# Patient Record
Sex: Male | Born: 1960 | Race: Black or African American | Hispanic: No | Marital: Married | Smoking: Former smoker
Health system: Southern US, Community
[De-identification: ages and names within clinical notes are randomized; demographics above are authoritative.]

## PROBLEM LIST (undated history)

## (undated) DIAGNOSIS — I10 Essential (primary) hypertension: Secondary | ICD-10-CM

## (undated) DIAGNOSIS — E7800 Pure hypercholesterolemia, unspecified: Secondary | ICD-10-CM

---

## 1999-03-02 ENCOUNTER — Ambulatory Visit: Admission: RE | Admit: 1999-03-02 | Discharge: 1999-03-02 | Payer: Self-pay | Admitting: Family Medicine

## 2006-03-01 ENCOUNTER — Emergency Department (HOSPITAL_COMMUNITY): Admission: EM | Admit: 2006-03-01 | Discharge: 2006-03-01 | Payer: Self-pay | Admitting: Emergency Medicine

## 2008-01-06 ENCOUNTER — Emergency Department (HOSPITAL_COMMUNITY): Admission: EM | Admit: 2008-01-06 | Discharge: 2008-01-06 | Payer: Self-pay | Admitting: Emergency Medicine

## 2011-03-06 ENCOUNTER — Emergency Department (HOSPITAL_COMMUNITY)
Admission: EM | Admit: 2011-03-06 | Discharge: 2011-03-06 | Disposition: A | Discharge status: Home / Self Care | Payer: PRIVATE HEALTH INSURANCE | Attending: Emergency Medicine | Admitting: Emergency Medicine

## 2011-03-06 DIAGNOSIS — E119 Type 2 diabetes mellitus without complications: Secondary | ICD-10-CM | POA: Insufficient documentation

## 2011-03-06 DIAGNOSIS — M545 Low back pain, unspecified: Secondary | ICD-10-CM | POA: Insufficient documentation

## 2011-03-06 DIAGNOSIS — I10 Essential (primary) hypertension: Secondary | ICD-10-CM | POA: Insufficient documentation

## 2011-06-14 MED ORDER — SODIUM CHLORIDE 0.45 % IV SOLN: Freq: Once | INTRAVENOUS | Status: DC

## 2011-06-15 ENCOUNTER — Encounter (INDEPENDENT_AMBULATORY_CARE_PROVIDER_SITE_OTHER): Payer: Self-pay | Admitting: Internal Medicine

## 2011-06-15 ENCOUNTER — Encounter (HOSPITAL_COMMUNITY): Admission: RE | Payer: Self-pay | Source: Ambulatory Visit

## 2011-06-15 ENCOUNTER — Ambulatory Visit (HOSPITAL_COMMUNITY)
Admission: RE | Admit: 2011-06-15 | Payer: PRIVATE HEALTH INSURANCE | Source: Ambulatory Visit | Admitting: Internal Medicine

## 2011-06-15 SURGERY — COLONOSCOPY
Anesthesia: Moderate Sedation

## 2011-07-13 ENCOUNTER — Telehealth (INDEPENDENT_AMBULATORY_CARE_PROVIDER_SITE_OTHER): Payer: Self-pay | Admitting: *Deleted

## 2011-07-13 DIAGNOSIS — Z1211 Encounter for screening for malignant neoplasm of colon: Secondary | ICD-10-CM

## 2011-07-13 NOTE — Telephone Encounter (Signed)
TCS sch'd 09/27/11 @ 10:45 (9:45), 1/2 metformin day before, movi prep instructions given

## 2011-08-22 ENCOUNTER — Other Ambulatory Visit (INDEPENDENT_AMBULATORY_CARE_PROVIDER_SITE_OTHER): Payer: Self-pay | Admitting: *Deleted

## 2011-08-22 DIAGNOSIS — Z1211 Encounter for screening for malignant neoplasm of colon: Secondary | ICD-10-CM

## 2011-09-04 ENCOUNTER — Telehealth (INDEPENDENT_AMBULATORY_CARE_PROVIDER_SITE_OTHER): Payer: Self-pay | Admitting: *Deleted

## 2011-09-04 MED ORDER — PEG-KCL-NACL-NASULF-NA ASC-C 100 G PO SOLR: 1.0000 | Freq: Once | ORAL | Status: DC

## 2011-09-04 NOTE — Telephone Encounter (Signed)
Patient needs movi prep 

## 2011-09-13 ENCOUNTER — Telehealth (INDEPENDENT_AMBULATORY_CARE_PROVIDER_SITE_OTHER): Payer: Self-pay | Admitting: *Deleted

## 2011-09-13 NOTE — Telephone Encounter (Signed)
PCP/Requesting MD:  Terie Purser  Name: Evan Estes  DOB: February 06, 2061  Home Phone: [phone]      Procedure: TCS  Reason/Indication:  SCREENING  Has patient had this procedure before?  NO  If so, when, by whom and where?    Is there a family history of colon cancer?  PATIENT NOT SURE  Who?  What age when diagnosed?    Is patient diabetic?   YES      Does patient have prosthetic heart valve?  NO  Do you have a pacemaker?  NO  Has patient had joint replacement within last 12 months?  NO  Is patient on Coumadin, Plavix and/or Aspirin? NO  Medications: PROVEX CV DAILY, METFORMIN 500 MG BID, LISINOPRIL 10 MG DAILY  Allergies: NKDA  Pharmacy: [email or web address]  Medication Adjustment: TAKE 1/2 NORMAL DOSE OF METFORMIN DAY BEFORE  Procedure date & time: 09/27/11 @ 10:45

## 2011-09-13 NOTE — Telephone Encounter (Signed)
Agree with colonoscopy.

## 2011-09-26 MED ORDER — SODIUM CHLORIDE 0.45 % IV SOLN
Freq: Once | INTRAVENOUS | Status: AC
  Administered 2011-09-27: 1000 mL via INTRAVENOUS

## 2011-09-27 ENCOUNTER — Encounter (HOSPITAL_COMMUNITY): Payer: Self-pay | Admitting: *Deleted

## 2011-09-27 ENCOUNTER — Ambulatory Visit (HOSPITAL_COMMUNITY)
Admission: RE | Admit: 2011-09-27 | Discharge: 2011-09-27 | Disposition: A | Discharge status: Home / Self Care | Payer: BC Managed Care – PPO | Source: Ambulatory Visit | Attending: Internal Medicine | Admitting: Internal Medicine

## 2011-09-27 ENCOUNTER — Encounter (HOSPITAL_COMMUNITY)
Admission: RE | Disposition: A | Discharge status: Home / Self Care | Payer: Self-pay | Source: Ambulatory Visit | Attending: Internal Medicine

## 2011-09-27 ENCOUNTER — Other Ambulatory Visit (INDEPENDENT_AMBULATORY_CARE_PROVIDER_SITE_OTHER): Payer: Self-pay | Admitting: Internal Medicine

## 2011-09-27 DIAGNOSIS — D128 Benign neoplasm of rectum: Secondary | ICD-10-CM

## 2011-09-27 DIAGNOSIS — Z1211 Encounter for screening for malignant neoplasm of colon: Secondary | ICD-10-CM

## 2011-09-27 DIAGNOSIS — K644 Residual hemorrhoidal skin tags: Secondary | ICD-10-CM

## 2011-09-27 DIAGNOSIS — Z79899 Other long term (current) drug therapy: Secondary | ICD-10-CM | POA: Insufficient documentation

## 2011-09-27 DIAGNOSIS — E119 Type 2 diabetes mellitus without complications: Secondary | ICD-10-CM | POA: Insufficient documentation

## 2011-09-27 DIAGNOSIS — D129 Benign neoplasm of anus and anal canal: Secondary | ICD-10-CM | POA: Insufficient documentation

## 2011-09-27 DIAGNOSIS — Z01812 Encounter for preprocedural laboratory examination: Secondary | ICD-10-CM | POA: Insufficient documentation

## 2011-09-27 DIAGNOSIS — I10 Essential (primary) hypertension: Secondary | ICD-10-CM | POA: Insufficient documentation

## 2011-09-27 HISTORY — PX: COLONOSCOPY: SHX5424

## 2011-09-27 HISTORY — DX: Essential (primary) hypertension: I10

## 2011-09-27 SURGERY — COLONOSCOPY
Anesthesia: Moderate Sedation

## 2011-09-27 MED ORDER — STERILE WATER FOR IRRIGATION IR SOLN
Status: DC | PRN
  Administered 2011-09-27: 10:00:00

## 2011-09-27 MED ORDER — MEPERIDINE HCL 50 MG/ML IJ SOLN
INTRAMUSCULAR | Status: AC
  Filled 2011-09-27: qty 1

## 2011-09-27 MED ORDER — MIDAZOLAM HCL 5 MG/5ML IJ SOLN
INTRAMUSCULAR | Status: DC | PRN
  Administered 2011-09-27 (×3): 2 mg via INTRAVENOUS

## 2011-09-27 MED ORDER — MIDAZOLAM HCL 5 MG/5ML IJ SOLN
INTRAMUSCULAR | Status: DC
Start: 2011-09-27 — End: 2011-09-27
  Filled 2011-09-27: qty 10

## 2011-09-27 MED ORDER — MEPERIDINE HCL 50 MG/ML IJ SOLN
INTRAMUSCULAR | Status: DC | PRN
  Administered 2011-09-27 (×2): 25 mg via INTRAVENOUS

## 2011-09-27 NOTE — Op Note (Addendum)
COLONOSCOPY PROCEDURE REPORT  PATIENT:  Evan Estes  MR#:  [id number] Birthdate:  Jul 21, 1961, 50 y.o., male Endoscopist:  Dr. Malissa Hippo, MD Referred By:  Dr. Colette Ribas, MD Procedure Date: 09/27/2011  Procedure:   Colonoscopy  Indications:  Patient is 50 year old African male who is undergoing average risk screening colonoscopy.  Informed Consent: Procedure and risks were reviewed with the patient and informed consent obtained.   Medications:  Demerol 50 mg IV Versed 6 mg IV  Description of procedure:  After a digital rectal exam was performed, that colonoscope was advanced from the anus through the rectum and colon to the area of the cecum, ileocecal valve and appendiceal orifice. The cecum was deeply intubated. These structures were well-seen and photographed for the record. From the level of the cecum and ileocecal valve, the scope was slowly and cautiously withdrawn. The mucosal surfaces were carefully surveyed utilizing scope tip to flexion to facilitate fold flattening as needed. The scope was pulled down into the rectum where a thorough exam including retroflexion was performed.  Findings:   Prep excellent. Colonic mucosa normal throughout. 3 mm rectal polyp ablated via cold biopsy. Small hemorrhoids below the dentate line.  Therapeutic/Diagnostic Maneuvers Performed:  See above  Complications:  None  Cecal Withdrawal Time:  14 minutes  Impression:  Examination performed to cecum. Small rectal polyp ablated via cold biopsy. Small external hemorrhoids.  Recommendations:  Standard instructions given. I will be contacting patient with results of biopsy.  [name]  09/27/2011 10:57 AM  CC: Dr. No primary provider on file. & Dr. No ref. provider found  Patient was given prescription for metformin 500 mg by mouth twice a day 60 does is with 5 refills. Should also advised to make an appointment to see his PCP

## 2011-09-27 NOTE — H&P (Signed)
Evan Estes is an 50 y.o. male.   Chief Complaint: Patient is here for colonoscopy. HPI: Patient is 50 year old of white male who is in for average risk screening colonoscopy. He denies abdominal pain, change in bowel habits or rectal bleeding. Family history is negative for colorectal carcinoma.  Past Medical History  Diagnosis Date  . Hypertension   . Diabetes mellitus     History reviewed. No pertinent past surgical history.  Family History  Problem Relation Age of Onset  . Anesthesia problems Neg Hx    Social History:  reports that he has been smoking Cigars.  He does not have any smokeless tobacco history on file. He reports that he drinks alcohol. He reports that he does not use illicit drugs.  Allergies: Not on File  Medications Prior to Admission  Medication Dose Route Frequency Provider Last Rate Last Dose  . 0.45 % sodium chloride infusion   Intravenous Once Malissa Hippo, MD 20 mL/hr at 09/27/11 1017 1,000 mL at 09/27/11 1017  . meperidine (DEMEROL) 50 MG/ML injection           . midazolam (VERSED) 5 MG/5ML injection            Medications Prior to Admission  Medication Sig Dispense Refill  . lisinopril (PRINIVIL,ZESTRIL) 10 MG tablet Take 10 mg by mouth daily.        . metFORMIN (GLUCOPHAGE) 500 MG tablet Take 500 mg by mouth 2 (two) times daily with a meal.        . UNABLE TO FIND daily. Provex CV         Results for orders placed during the hospital encounter of 09/27/11 (from the past 48 hour(s))  GLUCOSE, CAPILLARY     Status: Abnormal   Collection Time   09/27/11 10:14 AM      Component Value Range Comment   Glucose-Capillary 127 (*) 70 - 99 (mg/dL)    Comment 1 Documented in Chart      No results found.  Review of Systems  Constitutional: Negative for malaise/fatigue.  Gastrointestinal: Negative for abdominal pain, diarrhea, constipation, blood in stool and melena.    Pulse 78, temperature 98.5 F (36.9 C), temperature source Oral, resp. rate  20, height 5\' 8"  (1.727 m), weight 190 lb (86.183 kg), SpO2 97.00%. Physical Exam  Constitutional: He appears well-developed and well-nourished.  HENT:  Mouth/Throat: Oropharynx is clear and moist.  Eyes: Conjunctivae are normal. No scleral icterus.  Neck: No thyromegaly present.  Cardiovascular: Normal rate, regular rhythm and normal heart sounds.   No murmur heard. Respiratory: Effort normal.  GI: Soft. He exhibits no distension and no mass. There is no tenderness.  Musculoskeletal: He exhibits no edema.  Lymphadenopathy:    He has no cervical adenopathy.  Neurological: He is alert.  Skin: Skin is warm and dry.     Assessment/Plan Average risk screening colonoscopy.  Evan Estes U 09/27/2011, 10:26 AM

## 2011-10-12 ENCOUNTER — Encounter (HOSPITAL_COMMUNITY): Payer: Self-pay | Admitting: Internal Medicine

## 2011-10-18 ENCOUNTER — Encounter (INDEPENDENT_AMBULATORY_CARE_PROVIDER_SITE_OTHER): Payer: Self-pay | Admitting: *Deleted

## 2014-02-04 ENCOUNTER — Emergency Department (HOSPITAL_COMMUNITY): Payer: PRIVATE HEALTH INSURANCE

## 2014-02-04 ENCOUNTER — Encounter (HOSPITAL_COMMUNITY): Payer: Self-pay | Admitting: Emergency Medicine

## 2014-02-04 ENCOUNTER — Emergency Department (HOSPITAL_COMMUNITY)
Admission: EM | Admit: 2014-02-04 | Discharge: 2014-02-04 | Disposition: A | Discharge status: Home / Self Care | Payer: PRIVATE HEALTH INSURANCE | Attending: Emergency Medicine | Admitting: Emergency Medicine

## 2014-02-04 DIAGNOSIS — Z79899 Other long term (current) drug therapy: Secondary | ICD-10-CM | POA: Insufficient documentation

## 2014-02-04 DIAGNOSIS — F17200 Nicotine dependence, unspecified, uncomplicated: Secondary | ICD-10-CM | POA: Insufficient documentation

## 2014-02-04 DIAGNOSIS — I10 Essential (primary) hypertension: Secondary | ICD-10-CM | POA: Insufficient documentation

## 2014-02-04 DIAGNOSIS — R002 Palpitations: Secondary | ICD-10-CM

## 2014-02-04 DIAGNOSIS — E119 Type 2 diabetes mellitus without complications: Secondary | ICD-10-CM | POA: Insufficient documentation

## 2014-02-04 DIAGNOSIS — I495 Sick sinus syndrome: Secondary | ICD-10-CM | POA: Insufficient documentation

## 2014-02-04 DIAGNOSIS — R51 Headache: Secondary | ICD-10-CM | POA: Insufficient documentation

## 2014-02-04 LAB — CBC WITH DIFFERENTIAL/PLATELET
BASOS ABS: 0 10*3/uL (ref 0.0–0.1)
BASOS PCT: 0 % (ref 0–1)
Eosinophils Absolute: 0.2 10*3/uL (ref 0.0–0.7)
Eosinophils Relative: 3 % (ref 0–5)
HCT: 41.6 % (ref 39.0–52.0)
HEMOGLOBIN: 14.1 g/dL (ref 13.0–17.0)
Lymphocytes Relative: 43 % (ref 12–46)
Lymphs Abs: 2.9 10*3/uL (ref 0.7–4.0)
MCH: 26.4 pg (ref 26.0–34.0)
MCHC: 33.9 g/dL (ref 30.0–36.0)
MCV: 77.9 fL — ABNORMAL LOW (ref 78.0–100.0)
Monocytes Absolute: 0.5 10*3/uL (ref 0.1–1.0)
Monocytes Relative: 7 % (ref 3–12)
NEUTROS ABS: 3.1 10*3/uL (ref 1.7–7.7)
NEUTROS PCT: 47 % (ref 43–77)
PLATELETS: 279 10*3/uL (ref 150–400)
RBC: 5.34 MIL/uL (ref 4.22–5.81)
RDW: 15.5 % (ref 11.5–15.5)
WBC: 6.8 10*3/uL (ref 4.0–10.5)

## 2014-02-04 LAB — COMPREHENSIVE METABOLIC PANEL
ALBUMIN: 3.7 g/dL (ref 3.5–5.2)
ALK PHOS: 78 U/L (ref 39–117)
ALT: 19 U/L (ref 0–53)
AST: 25 U/L (ref 0–37)
BILIRUBIN TOTAL: 0.3 mg/dL (ref 0.3–1.2)
BUN: 20 mg/dL (ref 6–23)
CHLORIDE: 100 meq/L (ref 96–112)
CO2: 28 mEq/L (ref 19–32)
Calcium: 9.6 mg/dL (ref 8.4–10.5)
Creatinine, Ser: 0.96 mg/dL (ref 0.50–1.35)
GFR calc Af Amer: >90 mL/min (ref >90)
GFR calc non Af Amer: >90 mL/min (ref >90)
Glucose, Bld: 166 mg/dL — ABNORMAL HIGH (ref 70–99)
POTASSIUM: 3.1 meq/L — AB (ref 3.7–5.3)
SODIUM: 140 meq/L (ref 137–147)
TOTAL PROTEIN: 6.9 g/dL (ref 6.0–8.3)

## 2014-02-04 LAB — URINALYSIS, ROUTINE W REFLEX MICROSCOPIC
BILIRUBIN URINE: NEGATIVE
GLUCOSE, UA: NEGATIVE mg/dL
KETONES UR: NEGATIVE mg/dL
LEUKOCYTES UA: NEGATIVE
Nitrite: NEGATIVE
PH: 6 (ref 5.0–8.0)
Protein, ur: NEGATIVE mg/dL
Urobilinogen, UA: 0.2 mg/dL (ref 0.0–1.0)

## 2014-02-04 LAB — URINE MICROSCOPIC-ADD ON

## 2014-02-04 LAB — TROPONIN I: Troponin I: <0.3 ng/mL (ref <0.30)

## 2014-02-04 NOTE — ED Notes (Signed)
Discharge instructions reviewed with pt, questions answered. Pt verbalized understanding.  

## 2014-02-04 NOTE — Discharge Instructions (Signed)
Arterial Hypertension Take your medications as prescribed. Follow up with your doctor. Return to the ED if you develop new or worsening symptoms. Arterial hypertension (high blood pressure) is a condition of elevated pressure in your blood vessels. Hypertension over a long period of time is a risk factor for strokes, heart attacks, and heart failure. It is also the leading cause of kidney (renal) failure.  CAUSES   In Adults -- Over 90% of all hypertension has no known cause. This is called essential or primary hypertension. In the other 10% of people with hypertension, the increase in blood pressure is caused by another disorder. This is called secondary hypertension. Important causes of secondary hypertension are:  Heavy alcohol use.  Obstructive sleep apnea.  Hyperaldosterosim (Conn's syndrome).  Steroid use.  Chronic kidney failure.  Hyperparathyroidism.  Medications.  Renal artery stenosis.  Pheochromocytoma.  Cushing's disease.  Coarctation of the aorta.  Scleroderma renal crisis.  Licorice (in excessive amounts).  Drugs (cocaine, methamphetamine). Your caregiver can explain any items above that apply to you.  In Children -- Secondary hypertension is more common and should always be considered.  Pregnancy -- Few women of childbearing age have high blood pressure. However, up to 10% of them develop hypertension of pregnancy. Generally, this will not harm the woman. It may be a sign of 3 complications of pregnancy: preeclampsia, HELLP syndrome, and eclampsia. Follow up and control with medication is necessary. SYMPTOMS   This condition normally does not produce any noticeable symptoms. It is usually found during a routine exam.  Malignant hypertension is a late problem of high blood pressure. It may have the following symptoms:  Headaches.  Blurred vision.  End-organ damage (this means your kidneys, heart, lungs, and other organs are being damaged).  Stressful  situations can increase the blood pressure. If a person with normal blood pressure has their blood pressure go up while being seen by their caregiver, this is often termed "white coat hypertension." Its importance is not known. It may be related with eventually developing hypertension or complications of hypertension.  Hypertension is often confused with mental tension, stress, and anxiety. DIAGNOSIS  The diagnosis is made by 3 separate blood pressure measurements. They are taken at least 1 week apart from each other. If there is organ damage from hypertension, the diagnosis may be made without repeat measurements. Hypertension is usually identified by having blood pressure readings:  Above 140/90 mmHg measured in both arms, at 3 separate times, over a couple weeks.  Over 130/80 mmHg should be considered a risk factor and may require treatment in patients with diabetes. Blood pressure readings over 120/80 mmHg are called "pre-hypertension" even in non-diabetic patients. To get a true blood pressure measurement, use the following guidelines. Be aware of the factors that can alter blood pressure readings.  Take measurements at least 1 hour after caffeine.  Take measurements 30 minutes after smoking and without any stress. This is another reason to quit smoking  it raises your blood pressure.  Use a proper cuff size. Ask your caregiver if you are not sure about your cuff size.  Most home blood pressure cuffs are automatic. They will measure systolic and diastolic pressures. The systolic pressure is the pressure reading at the start of sounds. Diastolic pressure is the pressure at which the sounds disappear. If you are elderly, measure pressures in multiple postures. Try sitting, lying or standing.  Sit at rest for a minimum of 5 minutes before taking measurements.  You should not  be on any medications like decongestants. These are found in many cold medications.  Record your blood pressure  readings and review them with your caregiver. If you have hypertension:  Your caregiver may do tests to be sure you do not have secondary hypertension (see "causes" above).  Your caregiver may also look for signs of metabolic syndrome. This is also called Syndrome X or Insulin Resistance Syndrome. You may have this syndrome if you have type 2 diabetes, abdominal obesity, and abnormal blood lipids in addition to hypertension.  Your caregiver will take your medical and family history and perform a physical exam.  Diagnostic tests may include blood tests (for glucose, cholesterol, potassium, and kidney function), a urinalysis, or an EKG. Other tests may also be necessary depending on your condition. PREVENTION  There are important lifestyle issues that you can adopt to reduce your chance of developing hypertension:  Maintain a normal weight.  Limit the amount of salt (sodium) in your diet.  Exercise often.  Limit alcohol intake.  Get enough potassium in your diet. Discuss specific advice with your caregiver.  Follow a DASH diet (dietary approaches to stop hypertension). This diet is rich in fruits, vegetables, and low-fat dairy products, and avoids certain fats. PROGNOSIS  Essential hypertension cannot be cured. Lifestyle changes and medical treatment can lower blood pressure and reduce complications. The prognosis of secondary hypertension depends on the underlying cause. Many people whose hypertension is controlled with medicine or lifestyle changes can live a normal, healthy life.  RISKS AND COMPLICATIONS  While high blood pressure alone is not an illness, it often requires treatment due to its short- and long-term effects on many organs. Hypertension increases your risk for:  CVAs or strokes (cerebrovascular accident).  Heart failure due to chronically high blood pressure (hypertensive cardiomyopathy).  Heart attack (myocardial infarction).  Damage to the retina (hypertensive  retinopathy).  Kidney failure (hypertensive nephropathy). Your caregiver can explain list items above that apply to you. Treatment of hypertension can significantly reduce the risk of complications. TREATMENT   For overweight patients, weight loss and regular exercise are recommended. Physical fitness lowers blood pressure.  Mild hypertension is usually treated with diet and exercise. A diet rich in fruits and vegetables, fat-free dairy products, and foods low in fat and salt (sodium) can help lower blood pressure. Decreasing salt intake decreases blood pressure in a 1/3 of people.  Stop smoking if you are a smoker. The steps above are highly effective in reducing blood pressure. While these actions are easy to suggest, they are difficult to achieve. Most patients with moderate or severe hypertension end up requiring medications to bring their blood pressure down to a normal level. There are several classes of medications for treatment. Blood pressure pills (antihypertensives) will lower blood pressure by their different actions. Lowering the blood pressure by 10 mmHg may decrease the risk of complications by as much as 25%. The goal of treatment is effective blood pressure control. This will reduce your risk for complications. Your caregiver will help you determine the best treatment for you according to your lifestyle. What is excellent treatment for one person, may not be for you. HOME CARE INSTRUCTIONS   Do not smoke.  Follow the lifestyle changes outlined in the "Prevention" section.  If you are on medications, follow the directions carefully. Blood pressure medications must be taken as prescribed. Skipping doses reduces their benefit. It also puts you at risk for problems.  Follow up with your caregiver, as directed.  If  you are asked to monitor your blood pressure at home, follow the guidelines in the "Diagnosis" section above. SEEK MEDICAL CARE IF:   You think you are having  medication side effects.  You have recurrent headaches or lightheadedness.  You have swelling in your ankles.  You have trouble with your vision. SEEK IMMEDIATE MEDICAL CARE IF:   You have sudden onset of chest pain or pressure, difficulty breathing, or other symptoms of a heart attack.  You have a severe headache.  You have symptoms of a stroke (such as sudden weakness, difficulty speaking, difficulty walking). MAKE SURE YOU:   Understand these instructions.  Will watch your condition.  Will get help right away if you are not doing well or get worse. Document Released: 10/02/2005 Document Revised: 12/25/2011 Document Reviewed: 05/02/2007 [facility] Patient Information 2014 [place]. Palpitations  A palpitation is the feeling that your heartbeat is irregular or is faster than normal. It may feel like your heart is fluttering or skipping a beat. Palpitations are usually not a serious problem. However, in some cases, you may need further medical evaluation. CAUSES  Palpitations can be caused by:  Smoking.  Caffeine or other stimulants, such as diet pills or energy drinks.  Alcohol.  Stress and anxiety.  Strenuous physical activity.  Fatigue.  Certain medicines.  Heart disease, especially if you have a history of arrhythmias. This includes atrial fibrillation, atrial flutter, or supraventricular tachycardia.  An improperly working pacemaker or defibrillator. DIAGNOSIS  To find the cause of your palpitations, your caregiver will take your history and perform a physical exam. Tests may also be done, including:  Electrocardiography (ECG). This test records the heart's electrical activity.  Cardiac monitoring. This allows your caregiver to monitor your heart rate and rhythm in real time.  Holter monitor. This is a portable device that records your heartbeat and can help diagnose heart arrhythmias. It allows your caregiver to track your heart activity for several  days, if needed.  Stress tests by exercise or by giving medicine that makes the heart beat faster. TREATMENT  Treatment of palpitations depends on the cause of your symptoms and can vary greatly. Most cases of palpitations do not require any treatment other than time, relaxation, and monitoring your symptoms. Other causes, such as atrial fibrillation, atrial flutter, or supraventricular tachycardia, usually require further treatment. HOME CARE INSTRUCTIONS   Avoid:  Caffeinated coffee, tea, soft drinks, diet pills, and energy drinks.  Chocolate.  Alcohol.  Stop smoking if you smoke.  Reduce your stress and anxiety. Things that can help you relax include:  A method that measures bodily functions so you can learn to control them (biofeedback).  Yoga.  Meditation.  Physical activity such as swimming, jogging, or walking.  Get plenty of rest and sleep. SEEK MEDICAL CARE IF:   You continue to have a fast or irregular heartbeat beyond 24 hours.  Your palpitations occur more often. SEEK IMMEDIATE MEDICAL CARE IF:  You develop chest pain or shortness of breath.  You have a severe headache.  You feel dizzy, or you faint. MAKE SURE YOU:  Understand these instructions.  Will watch your condition.  Will get help right away if you are not doing well or get worse. Document Released: 09/29/2000 Document Revised: 01/27/2013 Document Reviewed: 12/01/2011 [facility] Patient Information 2014 [place].

## 2014-02-04 NOTE — ED Provider Notes (Signed)
CSN: [id number]     Arrival date & time 02/04/14  2031 History   This chart was scribed for Evan Essex, MD by Jenne Campus, ED Scribe. This patient was seen in room APA07/APA07 and the patient's care was started at 9:09 PM.   Chief Complaint  Patient presents with  . Palpitations     The history is provided by the patient. No language interpreter was used.   HPI Comments: Evan Estes is a 53 y.o. male who presents to the Emergency Department complaining of sudden onset of palpations in his chest for the past few hours. He describes the symptoms as "fluttering" with associated mild SOB, mild non-positional lightheadedness and mild, now resolved headaches.  He states that he has been taking one pill every 12 hours, more than his recommended dose without physician approval, in hopes of relieving the associated symptoms. Pt states that he monitors his blood pressure at home and received a reading of 170/90 tonight. BP in the ED is 173/71. He admits that's he ran out of HTN medication for one week ago and got them refilled yesterday. He admits to experiencing prior episodes of the same during periods of uncontrolled HTN. He denies any chest pain, visual disturbances, nausea or emesis as associated symptoms. He denies any prior h/o MI or cardiac issues.   Past Medical History  Diagnosis Date  . Hypertension   . Diabetes mellitus    Past Surgical History  Procedure Laterality Date  . Colonoscopy  09/27/2011    Procedure: COLONOSCOPY;  Surgeon: Rogene Houston, MD;  Location: [facility];  Service: Endoscopy;  Laterality: N/A;  10:45   Family History  Problem Relation Age of Onset  . Anesthesia problems Neg Hx    History  Substance Use Topics  . Smoking status: Current Every Day Smoker    Types: Cigars  . Smokeless tobacco: Not on file  . Alcohol Use: Yes     Comment: occasional    Review of Systems  A complete 10 system review of systems was obtained and all systems are  negative except as noted in the HPI and PMH.    Allergies  Review of patient's allergies indicates no known allergies.  Home Medications   Prior to Admission medications   Medication Sig Start Date End Date Taking? Authorizing Provider  lisinopril (PRINIVIL,ZESTRIL) 10 MG tablet Take 10 mg by mouth daily.      Historical Provider, MD  metFORMIN (GLUCOPHAGE) 500 MG tablet Take 500 mg by mouth 2 (two) times daily with a meal.      Historical Provider, MD   Triage vital: BP 173/71  Pulse 60  Temp(Src) 98 F (36.7 C) (Oral)  Resp 20  Ht 5\' 8"  (1.727 m)  Wt 180 lb (81.647 kg)  BMI 27.38 kg/m2  SpO2 100%  Physical Exam  Nursing note and vitals reviewed. Constitutional: He is oriented to person, place, and time. He appears well-developed and well-nourished. No distress.  HENT:  Head: Normocephalic and atraumatic.  Mouth/Throat: Oropharynx is clear and moist. No oropharyngeal exudate.  Eyes: Conjunctivae and EOM are normal. Pupils are equal, round, and reactive to light.  Neck: Normal range of motion. Neck supple. No tracheal deviation present.  Cardiovascular: Regular rhythm and normal heart sounds.  Bradycardia present.   No murmur heard. Pulmonary/Chest: Effort normal and breath sounds normal. No respiratory distress.  Abdominal: There is no tenderness. There is no rebound and no guarding.  Musculoskeletal: Normal range of motion. He exhibits  no edema and no tenderness.  Neurological: He is alert and oriented to person, place, and time. No cranial nerve deficit. He exhibits normal muscle tone. Coordination normal.  CN 2-12 intact, no ataxia on finger to nose, no nystagmus, 5/5 strength throughout, no pronator drift, Romberg negative, normal gait.  Skin: Skin is warm and dry.  Psychiatric: He has a normal mood and affect. His behavior is normal.    ED Course  Procedures (including critical care time)  DIAGNOSTIC STUDIES: Oxygen Saturation is 100% on RA, Normal by my  interpretation.    COORDINATION OF CARE: 9:18 PM-Discussed treatment plan which includes CXR, CBC panel and CMP with pt at bedside and pt agreed to plan.    Labs Review Labs Reviewed  CBC WITH DIFFERENTIAL - Abnormal; Notable for the following:    MCV 77.9 (*)    All other components within normal limits  COMPREHENSIVE METABOLIC PANEL - Abnormal; Notable for the following:    Potassium 3.1 (*)    Glucose, Bld 166 (*)    All other components within normal limits  URINALYSIS, ROUTINE W REFLEX MICROSCOPIC - Abnormal; Notable for the following:    Specific Gravity, Urine >1.030 (*)    Hgb urine dipstick TRACE (*)    All other components within normal limits  TROPONIN I  URINE MICROSCOPIC-ADD ON    Imaging Review Dg Chest 2 View  02/04/2014   CLINICAL DATA:  Short of breath  EXAM: CHEST - 2 VIEW  COMPARISON:  None.  FINDINGS: Normal heart size. Clear lungs. Mild hyperaeration. No pneumothorax or pleural effusion.  IMPRESSION: No active cardiopulmonary disease.   Electronically Signed   By: Maryclare Bean M.D.   On: 02/04/2014 22:34   Ct Head Wo Contrast  02/04/2014   CLINICAL DATA:  Hypertension, headache, heart palpitations.  EXAM: CT HEAD WITHOUT CONTRAST  TECHNIQUE: Contiguous axial images were obtained from the base of the skull through the vertex without intravenous contrast.  COMPARISON:  None.  FINDINGS: Ventricles and sulci appear symmetrical. No mass effect or midline shift. No abnormal extra-axial fluid collections. Gray-white matter junctions are distinct. Basal cisterns are not effaced. No evidence of acute intracranial hemorrhage. No depressed skull fractures. Visualized paranasal sinuses and mastoid air cells are not opacified.  IMPRESSION: No acute intracranial abnormalities.   Electronically Signed   By: Lucienne Capers M.D.   On: 02/04/2014 22:23     EKG Interpretation   Date/Time:  Wednesday February 04 2014 23:05:18 EDT Ventricular Rate:  52 PR Interval:  190 QRS Duration:  82 QT Interval:  450 QTC Calculation: 418 R Axis:   -2 Text Interpretation:  Sinus bradycardia Septal infarct (cited on or before  04-Feb-2014) Abnormal ECG When compared with ECG of 04-Feb-2014 20:42, No  significant change was found No significant change was found Confirmed by  Wyvonnia Dusky  MD, Yao Hyppolite [phone]) on 02/04/2014 11:57:18 PM      MDM   Final diagnoses:  Hypertension  Palpitations   Patient complains of palpitations that onset this afternoon. Denies any chest pain or shortness of breath. He is out of his blood pressure medications for the past week and just restarted them yesterday. Had a headache yesterday but none today. No focal weakness, numbness or tingling.  EKG sinus bradycardia without EKG changes. Orthostatics negative. Labs with mild hypokalemia.  nonfocal neuro exam. No headache, vision change, chest pain, SOB. No evidence of end organ damage.  Patient instructed to take BP med as prescribed and followup with his  PCP.  HR somewhat low in ED.  No dizziness or lightheadedness. Betablocker may not be best choice for blood pressure control.  Discuss with PCP this week. Return to ED if symptoms worsen.  BP 145/74  Pulse 52  Temp(Src) 98 F (36.7 C) (Oral)  Resp 20  Ht 5\' 8"  (1.727 m)  Wt 180 lb (81.647 kg)  BMI 27.38 kg/m2  SpO2 100%   I personally performed the services described in this documentation, which was scribed in my presence. The recorded information has been reviewed and is accurate.      Evan Essex, MD 02/05/14 1318

## 2014-02-04 NOTE — ED Notes (Signed)
Pt c/o hypertension and palpitations. Pt states he has been out of bp meds for over week and got them filled yesterday and started taking them at that time.

## 2014-06-21 ENCOUNTER — Emergency Department (HOSPITAL_COMMUNITY)
Admission: EM | Admit: 2014-06-21 | Discharge: 2014-06-21 | Disposition: A | Discharge status: Home / Self Care | Payer: PRIVATE HEALTH INSURANCE | Attending: Emergency Medicine | Admitting: Emergency Medicine

## 2014-06-21 ENCOUNTER — Encounter (HOSPITAL_COMMUNITY): Payer: Self-pay | Admitting: Emergency Medicine

## 2014-06-21 DIAGNOSIS — Y9389 Activity, other specified: Secondary | ICD-10-CM | POA: Diagnosis not present

## 2014-06-21 DIAGNOSIS — E119 Type 2 diabetes mellitus without complications: Secondary | ICD-10-CM | POA: Diagnosis not present

## 2014-06-21 DIAGNOSIS — M545 Low back pain, unspecified: Secondary | ICD-10-CM | POA: Diagnosis present

## 2014-06-21 DIAGNOSIS — S335XXA Sprain of ligaments of lumbar spine, initial encounter: Secondary | ICD-10-CM | POA: Diagnosis not present

## 2014-06-21 DIAGNOSIS — Z791 Long term (current) use of non-steroidal anti-inflammatories (NSAID): Secondary | ICD-10-CM | POA: Diagnosis not present

## 2014-06-21 DIAGNOSIS — Z79899 Other long term (current) drug therapy: Secondary | ICD-10-CM | POA: Diagnosis not present

## 2014-06-21 DIAGNOSIS — Z7982 Long term (current) use of aspirin: Secondary | ICD-10-CM | POA: Diagnosis not present

## 2014-06-21 DIAGNOSIS — Y929 Unspecified place or not applicable: Secondary | ICD-10-CM | POA: Insufficient documentation

## 2014-06-21 DIAGNOSIS — X500XXA Overexertion from strenuous movement or load, initial encounter: Secondary | ICD-10-CM | POA: Insufficient documentation

## 2014-06-21 DIAGNOSIS — S39012A Strain of muscle, fascia and tendon of lower back, initial encounter: Secondary | ICD-10-CM

## 2014-06-21 DIAGNOSIS — F17200 Nicotine dependence, unspecified, uncomplicated: Secondary | ICD-10-CM | POA: Insufficient documentation

## 2014-06-21 DIAGNOSIS — I10 Essential (primary) hypertension: Secondary | ICD-10-CM | POA: Diagnosis not present

## 2014-06-21 MED ORDER — METHOCARBAMOL 500 MG PO TABS: 500.0000 mg | ORAL_TABLET | Freq: Two times a day (BID) | ORAL | Status: DC | PRN

## 2014-06-21 MED ORDER — OXYCODONE-ACETAMINOPHEN 5-325 MG PO TABS
2.0000 | ORAL_TABLET | Freq: Once | ORAL | Status: AC
  Administered 2014-06-21: 2 via ORAL
  Filled 2014-06-21: qty 2

## 2014-06-21 MED ORDER — OXYCODONE-ACETAMINOPHEN 5-325 MG PO TABS
1.0000 | ORAL_TABLET | ORAL | Status: DC | PRN
Start: 2014-06-21 — End: 2020-12-30

## 2014-06-21 MED ORDER — NAPROXEN 250 MG PO TABS
500.0000 mg | ORAL_TABLET | Freq: Once | ORAL | Status: AC
  Administered 2014-06-21: 500 mg via ORAL
  Filled 2014-06-21: qty 2

## 2014-06-21 MED ORDER — NAPROXEN 500 MG PO TABS: 500.0000 mg | ORAL_TABLET | Freq: Two times a day (BID) | ORAL | Status: DC

## 2014-06-21 NOTE — ED Provider Notes (Signed)
CSN: [id number]     Arrival date & time 06/21/14  1007 History   This chart was scribed for Evan Acosta, MD, by Neta Ehlers, ED Scribe. This patient was seen in room APA03/APA03 and the patient's care was started at 10:14 AM.  First MD Initiated Contact with Patient 06/21/14 1013     Chief Complaint  Patient presents with  . Back Pain    The history is provided by the patient. No language interpreter was used.   HPI Comments: Evan Estes is a 53 y.o. male who presents to the Emergency Department complaining of right-sided lumbar pain which began three days ago. He states the pain is improved when he is stationary, but the pain is increased with twisting toward the left side or ambulating.  He has used 4 to 5 Motrin a day to address the pain with moderate relief.  The pt denies weakness or numbness to his legs. Mr. Volkov attributes the pain to standing on concrete floors at work; he denies excessive lifting at work. He endorses similar pain in the past; he has experienced the pain bilaterally in the past. He denies dysuria, urinary retention, or a fever. He also denies pathological red flags including a h/o cancer, HIV, or hepatitis, and the use of needles or steroids.    Past Medical History  Diagnosis Date  . Hypertension   . Diabetes mellitus    Past Surgical History  Procedure Laterality Date  . Colonoscopy  09/27/2011    Procedure: COLONOSCOPY;  Surgeon: Rogene Houston, MD;  Location: [facility];  Service: Endoscopy;  Laterality: N/A;  10:45   Family History  Problem Relation Age of Onset  . Anesthesia problems Neg Hx    History  Substance Use Topics  . Smoking status: Current Every Day Smoker    Types: Cigars  . Smokeless tobacco: Not on file  . Alcohol Use: Yes     Comment: occasional    Review of Systems  Genitourinary: Negative for dysuria, frequency, decreased urine volume and difficulty urinating.  Musculoskeletal: Positive for back pain.  Neurological:  Negative for weakness and numbness.    Allergies  Review of patient's allergies indicates no known allergies.  Home Medications   Prior to Admission medications   Medication Sig Start Date End Date Taking? Authorizing Provider  aspirin 325 MG tablet Take 325 mg by mouth daily as needed (Takes when he thinks about it.).    Historical Provider, MD  atenolol-chlorthalidone (TENORETIC) 50-25 MG per tablet Take 1 tablet by mouth daily.    Historical Provider, MD  metFORMIN (GLUCOPHAGE) 500 MG tablet Take 500 mg by mouth 2 (two) times daily with a meal.      Historical Provider, MD  methocarbamol (ROBAXIN) 500 MG tablet Take 1 tablet (500 mg total) by mouth 2 (two) times daily as needed for muscle spasms. 06/21/14   Evan Acosta, MD  naproxen (NAPROSYN) 500 MG tablet Take 1 tablet (500 mg total) by mouth 2 (two) times daily with a meal. 06/21/14   Evan Acosta, MD  oxyCODONE-acetaminophen (PERCOCET) 5-325 MG per tablet Take 1 tablet by mouth every 4 (four) hours as needed. 06/21/14   Evan Acosta, MD   Triage Vitals: BP 163/92  Pulse 80  Temp(Src) 98.4 F (36.9 C) (Oral)  Resp 20  Ht 5\' 8"  (1.727 m)  Wt 180 lb (81.647 kg)  BMI 27.38 kg/m2  SpO2 100%  Physical Exam  Nursing note and vitals reviewed.  Constitutional: He appears well-developed and well-nourished. No distress.  HENT:  Head: Normocephalic and atraumatic.  Eyes: Conjunctivae are normal. Right eye exhibits no discharge. Left eye exhibits no discharge. No scleral icterus.  Cardiovascular: Normal rate, regular rhythm, normal heart sounds and intact distal pulses.  Exam reveals no gallop and no friction rub.   No murmur heard. Pulmonary/Chest: Effort normal and breath sounds normal. No respiratory distress. He has no wheezes. He has no rales.  Abdominal: Soft. Bowel sounds are normal. He exhibits no distension and no mass. There is no tenderness.  Musculoskeletal: Normal range of motion. He exhibits tenderness. He exhibits no  edema.  Tenderness to right lumbar into the right iliac crest.  No straight leg raise pain. No pain with rotation of hip, more with forced flexion.    Neurological: He is alert. Coordination normal.  Normal strength, sensation and reflexes at the knees of the lower extremities  Skin: Skin is warm and dry. No rash noted. No erythema.  Psychiatric: He has a normal mood and affect. His behavior is normal.    ED Course  Procedures (including critical care time)  DIAGNOSTIC STUDIES: Oxygen Saturation is 100% on room air, normal by my interpretation.    COORDINATION OF CARE:  10:25 AM- Discussed treatment plan with patient, and the patient agreed to the plan. The plan includes muscle relaxants.   Labs Review Labs Reviewed - No data to display  Imaging Review No results found.    MDM   Final diagnoses:  Lumbar strain, initial encounter    Overall well appaering without red flags for pathological source for pain.  meds to be given as below, the pt was given the indications for return - expressed his understanding.    Meds given in ED:  Medications  oxyCODONE-acetaminophen (PERCOCET/ROXICET) 5-325 MG per tablet 2 tablet (2 tablets Oral Given 06/21/14 1037)  naproxen (NAPROSYN) tablet 500 mg (500 mg Oral Given 06/21/14 1037)    Discharge Medication List as of 06/21/2014 10:26 AM    START taking these medications   Details  methocarbamol (ROBAXIN) 500 MG tablet Take 1 tablet (500 mg total) by mouth 2 (two) times daily as needed for muscle spasms., Starting 06/21/2014, Until Discontinued, Print    naproxen (NAPROSYN) 500 MG tablet Take 1 tablet (500 mg total) by mouth 2 (two) times daily with a meal., Starting 06/21/2014, Until Discontinued, Print    oxyCODONE-acetaminophen (PERCOCET) 5-325 MG per tablet Take 1 tablet by mouth every 4 (four) hours as needed., Starting 06/21/2014, Until Discontinued, Print         I personally performed the services described in this documentation,  which was scribed in my presence. The recorded information has been reviewed and is accurate.       Evan Acosta, MD 06/21/14 [phone]

## 2014-06-21 NOTE — ED Notes (Signed)
Pt reports has chronic lower back pain and pain flared up Friday evening after work.  Says pain is in r lower back and radiates into r hip and upper leg.

## 2014-06-21 NOTE — Discharge Instructions (Signed)

## 2014-06-22 ENCOUNTER — Emergency Department (HOSPITAL_COMMUNITY)
Admission: EM | Admit: 2014-06-22 | Discharge: 2014-06-22 | Disposition: A | Discharge status: Home / Self Care | Payer: PRIVATE HEALTH INSURANCE | Attending: Emergency Medicine | Admitting: Emergency Medicine

## 2014-06-22 ENCOUNTER — Encounter (HOSPITAL_COMMUNITY): Payer: Self-pay | Admitting: Emergency Medicine

## 2014-06-22 ENCOUNTER — Emergency Department (HOSPITAL_COMMUNITY): Payer: PRIVATE HEALTH INSURANCE

## 2014-06-22 DIAGNOSIS — G8911 Acute pain due to trauma: Secondary | ICD-10-CM | POA: Insufficient documentation

## 2014-06-22 DIAGNOSIS — M129 Arthropathy, unspecified: Secondary | ICD-10-CM | POA: Diagnosis not present

## 2014-06-22 DIAGNOSIS — E119 Type 2 diabetes mellitus without complications: Secondary | ICD-10-CM | POA: Diagnosis not present

## 2014-06-22 DIAGNOSIS — Z791 Long term (current) use of non-steroidal anti-inflammatories (NSAID): Secondary | ICD-10-CM | POA: Insufficient documentation

## 2014-06-22 DIAGNOSIS — I10 Essential (primary) hypertension: Secondary | ICD-10-CM | POA: Insufficient documentation

## 2014-06-22 DIAGNOSIS — M1990 Unspecified osteoarthritis, unspecified site: Secondary | ICD-10-CM

## 2014-06-22 DIAGNOSIS — Z79899 Other long term (current) drug therapy: Secondary | ICD-10-CM | POA: Insufficient documentation

## 2014-06-22 DIAGNOSIS — F17200 Nicotine dependence, unspecified, uncomplicated: Secondary | ICD-10-CM | POA: Insufficient documentation

## 2014-06-22 DIAGNOSIS — M545 Low back pain, unspecified: Secondary | ICD-10-CM | POA: Insufficient documentation

## 2014-06-22 DIAGNOSIS — M5430 Sciatica, unspecified side: Secondary | ICD-10-CM | POA: Diagnosis not present

## 2014-06-22 DIAGNOSIS — M25559 Pain in unspecified hip: Secondary | ICD-10-CM | POA: Diagnosis not present

## 2014-06-22 DIAGNOSIS — M25551 Pain in right hip: Secondary | ICD-10-CM

## 2014-06-22 MED ORDER — MORPHINE SULFATE 4 MG/ML IJ SOLN
4.0000 mg | Freq: Once | INTRAMUSCULAR | Status: AC
Start: 2014-06-22 — End: 2014-06-22
  Administered 2014-06-22: 4 mg via INTRAMUSCULAR
  Filled 2014-06-22: qty 1

## 2014-06-22 MED ORDER — OXYCODONE-ACETAMINOPHEN 5-325 MG PO TABS
1.0000 | ORAL_TABLET | Freq: Once | ORAL | Status: AC
  Administered 2014-06-22: 1 via ORAL
  Filled 2014-06-22: qty 1

## 2014-06-22 MED ORDER — ONDANSETRON HCL 4 MG PO TABS
4.0000 mg | ORAL_TABLET | Freq: Once | ORAL | Status: AC
  Administered 2014-06-22: 4 mg via ORAL
  Filled 2014-06-22: qty 1

## 2014-06-22 MED ORDER — KETOROLAC TROMETHAMINE 10 MG PO TABS
10.0000 mg | ORAL_TABLET | Freq: Once | ORAL | Status: AC
  Administered 2014-06-22: 10 mg via ORAL
  Filled 2014-06-22: qty 1

## 2014-06-22 MED ORDER — DIAZEPAM 5 MG PO TABS
5.0000 mg | ORAL_TABLET | Freq: Once | ORAL | Status: AC
  Administered 2014-06-22: 5 mg via ORAL
  Filled 2014-06-22: qty 1

## 2014-06-22 NOTE — ED Notes (Signed)
Pt presents to ED c/o of lower back and RT thigh pain. Pt was treated Sunday for same complaint. Pt states medication he was given "is not working." NAD noted in triage.

## 2014-06-22 NOTE — ED Provider Notes (Signed)
Medical screening examination/treatment/procedure(s) were performed by non-physician practitioner and as supervising physician I was immediately available for consultation/collaboration.   Buddy Loeffelholz, MD 06/22/14 2237 

## 2014-06-22 NOTE — Discharge Instructions (Signed)
Your CT scan is negative for acute spine or vertebrae problems, there are signs of degenerative disease, or arthritis. Arthritis, Nonspecific Arthritis is inflammation of a joint. This usually means pain, redness, warmth or swelling are present. One or more joints may be involved. There are a number of types of arthritis. Your caregiver may not be able to tell what type of arthritis you have right away. CAUSES  The most common cause of arthritis is the wear and tear on the joint (osteoarthritis). This causes damage to the cartilage, which can break down over time. The knees, hips, back and neck are most often affected by this type of arthritis. Other types of arthritis and common causes of joint pain include:  Sprains and other injuries near the joint. Sometimes minor sprains and injuries cause pain and swelling that develop hours later.  Rheumatoid arthritis. This affects hands, feet and knees. It usually affects both sides of your body at the same time. It is often associated with chronic ailments, fever, weight loss and general weakness.  Crystal arthritis. Gout and pseudo gout can cause occasional acute severe pain, redness and swelling in the foot, ankle, or knee.  Infectious arthritis. Bacteria can get into a joint through a break in overlying skin. This can cause infection of the joint. Bacteria and viruses can also spread through the blood and affect your joints.  Drug, infectious and allergy reactions. Sometimes joints can become mildly painful and slightly swollen with these types of illnesses. SYMPTOMS   Pain is the main symptom.  Your joint or joints can also be red, swollen and warm or hot to the touch.  You may have a fever with certain types of arthritis, or even feel overall ill.  The joint with arthritis will hurt with movement. Stiffness is present with some types of arthritis. DIAGNOSIS  Your caregiver will suspect arthritis based on your description of your symptoms and  on your exam. Testing may be needed to find the type of arthritis:  Blood and sometimes urine tests.  X-ray tests and sometimes CT or MRI scans.  Removal of fluid from the joint (arthrocentesis) is done to check for bacteria, crystals or other causes. Your caregiver (or a specialist) will numb the area over the joint with a local anesthetic, and use a needle to remove joint fluid for examination. This procedure is only minimally uncomfortable.  Even with these tests, your caregiver may not be able to tell what kind of arthritis you have. Consultation with a specialist (rheumatologist) may be helpful. TREATMENT  Your caregiver will discuss with you treatment specific to your type of arthritis. If the specific type cannot be determined, then the following general recommendations may apply. Treatment of severe joint pain includes:  Rest.  Elevation.  Anti-inflammatory medication (for example, ibuprofen) may be prescribed. Avoiding activities that cause increased pain.  Only take over-the-counter or prescription medicines for pain and discomfort as recommended by your caregiver.  Cold packs over an inflamed joint may be used for 10 to 15 minutes every hour. Hot packs sometimes feel better, but do not use overnight. Do not use hot packs if you are diabetic without your caregiver's permission.  A cortisone shot into arthritic joints may help reduce pain and swelling.  Any acute arthritis that gets worse over the next 1 to 2 days needs to be looked at to be sure there is no joint infection. Long-term arthritis treatment involves modifying activities and lifestyle to reduce joint stress jarring. This can include  weight loss. Also, exercise is needed to nourish the joint cartilage and remove waste. This helps keep the muscles around the joint strong. HOME CARE INSTRUCTIONS   Do not take aspirin to relieve pain if gout is suspected. This elevates uric acid levels.  Only take over-the-counter or  prescription medicines for pain, discomfort or fever as directed by your caregiver.  Rest the joint as much as possible.  If your joint is swollen, keep it elevated.  Use crutches if the painful joint is in your leg.  Drinking plenty of fluids may help for certain types of arthritis.  Follow your caregiver's dietary instructions.  Try low-impact exercise such as:  Swimming.  Water aerobics.  Biking.  Walking.  Morning stiffness is often relieved by a warm shower.  Put your joints through regular range-of-motion. SEEK MEDICAL CARE IF:   You do not feel better in 24 hours or are getting worse.  You have side effects to medications, or are not getting better with treatment. SEEK IMMEDIATE MEDICAL CARE IF:   You have a fever.  You develop severe joint pain, swelling or redness.  Many joints are involved and become painful and swollen.  There is severe back pain and/or leg weakness.  You have loss of bowel or bladder control. Document Released: 11/09/2004 Document Revised: 12/25/2011 Document Reviewed: 11/25/2008 [facility] Patient Information 2015 [place], [place]. This information is not intended to replace advice given to you by your health care provider. Make sure you discuss any questions you have with your health care provider. . The x-ray of your pelvis is negative for fracture or dislocation or acute problem. Please apply heat to your back and hip. Please continue your current medications. Please see your primary physician for assistance with your pain management.

## 2014-06-22 NOTE — ED Notes (Signed)
Sima Matas, PAC, at bedside to assess.

## 2014-06-22 NOTE — ED Provider Notes (Signed)
CSN: [id number]     Arrival date & time 06/22/14  1854 History   None  This chart was scribed for non-physician practitioner Lily Kocher, PA-C, working with No att. providers found by Rosary Lively, ED scribe. This patient was seen in room APFT23/APFT23 and the patient's care was started at 8:05 PM.    Chief Complaint  Patient presents with  . Back Pain   Patient is a 53 y.o. male presenting with back pain. The history is provided by the patient. No language interpreter was used.  Back Pain Location:  Lumbar spine Quality:  Aching and shooting Radiates to: Right hip. Pain severity:  Severe Pain is:  Same all the time Timing:  Constant Progression:  Worsening Chronicity:  New Relieved by:  Nothing Worsened by:  Movement, standing and twisting Ineffective treatments:  Narcotics and NSAIDs Associated symptoms: no abdominal pain, no bladder incontinence, no bowel incontinence, no numbness and no paresthesias    HPI Comments:  Evan Estes is a 53 y.o. male who presents to the Emergency Department complaining of lower back and right hip pain. Pt reports that he was seen yesterday morning and diagnosed with lumbar strain. Pt reports that he has returned to ED because the medication he is taking is not providing adequate relief. Pt has been taking percocet and naproxen. Pt reports that he experienced relief this morning, however as day progressed he began to experience pain again. Pt reports that he is unable to return to work with the pain that he is experiencing. Pt denies diagnosis of ruptured or herniated disc. Pt denies surgery to back. Pt has not had imaging prior to yesterday. Pt goes to clinic for primary care.   Past Medical History  Diagnosis Date  . Hypertension   . Diabetes mellitus    Past Surgical History  Procedure Laterality Date  . Colonoscopy  09/27/2011    Procedure: COLONOSCOPY;  Surgeon: Rogene Houston, MD;  [facility];  Service: Endoscopy;  Laterality:  N/A;  10:45   Family History  Problem Relation Age of Onset  . Anesthesia problems Neg Hx    History  Substance Use Topics  . Smoking status: Current Every Day Smoker    Types: Cigars  . Smokeless tobacco: Not on file  . Alcohol Use: Yes     Comment: occasional    Review of Systems  Gastrointestinal: Negative for abdominal pain and bowel incontinence.  Genitourinary: Negative for bladder incontinence.  Musculoskeletal: Positive for arthralgias, back pain and myalgias.  Neurological: Negative for numbness and paresthesias.  All other systems reviewed and are negative.     Allergies  Review of patient's allergies indicates no known allergies.  Home Medications   Prior to Admission medications   Medication Sig Start Date End Date Taking? Authorizing Provider  aspirin 325 MG tablet Take 325 mg by mouth daily as needed (Takes when he thinks about it.).    Historical Provider, MD  atenolol-chlorthalidone (TENORETIC) 50-25 MG per tablet Take 1 tablet by mouth daily.    Historical Provider, MD  metFORMIN (GLUCOPHAGE) 500 MG tablet Take 500 mg by mouth 2 (two) times daily with a meal.      Historical Provider, MD  methocarbamol (ROBAXIN) 500 MG tablet Take 1 tablet (500 mg total) by mouth 2 (two) times daily as needed for muscle spasms. 06/21/14   Johnna Acosta, MD  naproxen (NAPROSYN) 500 MG tablet Take 1 tablet (500 mg total) by mouth 2 (two) times daily with  a meal. 06/21/14   Johnna Acosta, MD  oxyCODONE-acetaminophen (PERCOCET) 5-325 MG per tablet Take 1 tablet by mouth every 4 (four) hours as needed. 06/21/14   Johnna Acosta, MD   BP 165/85  Pulse 62  Temp(Src) 98.9 F (37.2 C) (Oral)  Resp 18  Ht 5\' 8"  (1.727 m)  Wt 190 lb (86.183 kg)  BMI 28.90 kg/m2  SpO2 100% Physical Exam  Nursing note and vitals reviewed. Constitutional: He is oriented to person, place, and time. He appears well-developed and well-nourished. No distress.  HENT:  Head: Normocephalic and atraumatic.   Eyes: EOM are normal. Pupils are equal, round, and reactive to light.  Neck: Normal range of motion. Neck supple.  Cardiovascular: Normal rate and regular rhythm.   Pulmonary/Chest: Effort normal and breath sounds normal. No respiratory distress. He has no wheezes. He has no rales.  Abdominal: Soft. Bowel sounds are normal. There is no tenderness.  Musculoskeletal: Normal range of motion. He exhibits no edema.       Lumbar back: He exhibits tenderness. He exhibits normal range of motion, no deformity, no spasm and normal pulse.  Paraspinal tenderness at the lower lumbar region on the right. Pain with attempted ROM of the right hip. No shortening appreciated.   Neurological: He is alert and oriented to person, place, and time. He has normal strength. No cranial nerve deficit or sensory deficit. Coordination and gait normal.  Reflex Scores:      Bicep reflexes are 2+ on the right side and 2+ on the left side.      Brachioradialis reflexes are 2+ on the right side and 2+ on the left side.      Patellar reflexes are 2+ on the right side and 2+ on the left side.      Achilles reflexes are 2+ on the right side and 2+ on the left side. Pt will not cooperate with complete neurologic examination due to pain.   Skin: Skin is warm and dry.  Psychiatric: He has a normal mood and affect. His behavior is normal.    ED Course  Procedures  DIAGNOSTIC STUDIES: Oxygen Saturation is 100% on RA, normal by my interpretation.  COORDINATION OF CARE: 8:18 PM-Discussed treatment plan which includes back xray with pt at bedside and pt agreed to plan.  Labs Review Labs Reviewed - No data to display  Imaging Review No results found.   EKG Interpretation None      MDM  Signs are within normal limits. Pulse oximetry is 100% on room air.  Patient remains in the wheelchair and will not get out of the chair because of pain. The examination is limited because the patient states he has pain of his back and  his hip area.  CT scan of the lumbar spine shows no fracture or dislocation. There is multilevel degenerative changes. The central canal remains widely patent. X-ray of the right hip and pelvis shows no fracture or dislocation. The bilateral hip spaces are preserved. No other problems appreciated.  The test results were given to the patient in terms he and his significant other understood. The patient expressed that he was leaving the same way that he came in. We discussed the findings again of the CT scan and the x-ray and the diagnoses that these 2 diagnostic tests excluded. We discussed inflammation. The patient was given instructions to see his primary physician for assistance with his pain management. He was also given the name of a spine specialist in  [place] to attempt to make contact with for assistance with his discomfort at this point. Patient is advised to continue his Naprosyn and Percocet. A dose of Toradol and Percocet was given to the patient at discharge.    Final diagnoses:  None    *I have reviewed nursing notes, vital signs, and all appropriate lab and imaging results for this patient.**  **I personally performed the services described in this documentation, which was scribed in my presence. The recorded information has been reviewed and is accurate.Lenox Ahr, PA-C 06/22/14 2219

## 2014-07-24 ENCOUNTER — Ambulatory Visit (INDEPENDENT_AMBULATORY_CARE_PROVIDER_SITE_OTHER): Payer: Commercial Managed Care - PPO | Admitting: Emergency Medicine

## 2014-07-24 VITALS — BP 140/88 | HR 60 | Temp 98.1°F | Resp 16 | Ht 66.25 in | Wt 189.4 lb

## 2014-07-24 DIAGNOSIS — M545 Low back pain: Secondary | ICD-10-CM

## 2014-07-24 NOTE — Patient Instructions (Signed)
Back Pain, Adult Low back pain is very common. About 1 in 5 people have back pain.The cause of low back pain is rarely dangerous. The pain often gets better over time.About half of people with a sudden onset of back pain feel better in just 2 weeks. About 8 in 10 people feel better by 6 weeks.  CAUSES Some common causes of back pain include:  Strain of the muscles or ligaments supporting the spine.  Wear and tear (degeneration) of the spinal discs.  Arthritis.  Direct injury to the back. DIAGNOSIS Most of the time, the direct cause of low back pain is not known.However, back pain can be treated effectively even when the exact cause of the pain is unknown.Answering your caregiver's questions about your overall health and symptoms is one of the most accurate ways to make sure the cause of your pain is not dangerous. If your caregiver needs more information, he or she may order lab work or imaging tests (X-rays or MRIs).However, even if imaging tests show changes in your back, this usually does not require surgery. HOME CARE INSTRUCTIONS For many people, back pain returns.Since low back pain is rarely dangerous, it is often a condition that people can learn to manageon their own.   Remain active. It is stressful on the back to sit or stand in one place. Do not sit, drive, or stand in one place for more than 30 minutes at a time. Take short walks on level surfaces as soon as pain allows.Try to increase the length of time you walk each day.  Do not stay in bed.Resting more than 1 or 2 days can delay your recovery.  Do not avoid exercise or work.Your body is made to move.It is not dangerous to be active, even though your back may hurt.Your back will likely heal faster if you return to being active before your pain is gone.  Pay attention to your body when you bend and lift. Many people have less discomfortwhen lifting if they bend their knees, keep the load close to their bodies,and  avoid twisting. Often, the most comfortable positions are those that put less stress on your recovering back.  Find a comfortable position to sleep. Use a firm mattress and lie on your side with your knees slightly bent. If you lie on your back, put a pillow under your knees.  Only take over-the-counter or prescription medicines as directed by your caregiver. Over-the-counter medicines to reduce pain and inflammation are often the most helpful.Your caregiver may prescribe muscle relaxant drugs.These medicines help dull your pain so you can more quickly return to your normal activities and healthy exercise.  Put ice on the injured area.  Put ice in a plastic bag.  Place a towel between your skin and the bag.  Leave the ice on for 15-20 minutes, 03-04 times a day for the first 2 to 3 days. After that, ice and heat may be alternated to reduce pain and spasms.  Ask your caregiver about trying back exercises and gentle massage. This may be of some benefit.  Avoid feeling anxious or stressed.Stress increases muscle tension and can worsen back pain.It is important to recognize when you are anxious or stressed and learn ways to manage it.Exercise is a great option. SEEK MEDICAL CARE IF:  You have pain that is not relieved with rest or medicine.  You have pain that does not improve in 1 week.  You have new symptoms.  You are generally not feeling well. SEEK   IMMEDIATE MEDICAL CARE IF:   You have pain that radiates from your back into your legs.  You develop new bowel or bladder control problems.  You have unusual weakness or numbness in your arms or legs.  You develop nausea or vomiting.  You develop abdominal pain.  You feel faint. Document Released: 10/02/2005 Document Revised: 04/02/2012 Document Reviewed: 02/03/2014 [employer] Patient Information 2015 [employer]. This information is not intended to replace advice given to you by your health care provider. Make sure you  discuss any questions you have with your health care provider.  

## 2014-07-24 NOTE — Progress Notes (Signed)
[facility] [address], [place] [address] [phone]  Date:  07/24/2014   Name:  Evan Estes   DOB:  [birth year]   MRN:  [id number]  PCP:  Purvis Kilts, MD    Chief Complaint: Paperwork   History of Present Illness:  Evan Estes is a 53 y.o. very pleasant male patient who presents with the following:  Patient comes to the office following back-to-back visits to the er for back pain.  Put off work until Thursday and given percocet due pain in back preventing him from working was wheelchair bound. Now to office following improvement.  Has improved pain and is off the weekend. He is requesting a "prn" FMLA form "in case I need it in the future". Pain non radiating.  No weakness or neuro symptoms. No improvement with over the counter medications or other home remedies.  Denies other complaint or health concern today.   There are no active problems to display for this patient.   Past Medical History  Diagnosis Date  . Hypertension   . Diabetes mellitus     Past Surgical History  Procedure Laterality Date  . Colonoscopy  09/27/2011    Procedure: COLONOSCOPY;  Surgeon: Rogene Houston, MD;  [facility];  Service: Endoscopy;  Laterality: N/A;  10:45    History  Substance Use Topics  . Smoking status: Current Every Day Smoker    Types: Cigars  . Smokeless tobacco: Not on file  . Alcohol Use: Yes     Comment: occasional    Family History  Problem Relation Age of Onset  . Anesthesia problems Neg Hx     No Known Allergies  Medication list has been reviewed and updated.  Current Outpatient Prescriptions on File Prior to Visit  Medication Sig Dispense Refill  . atenolol-chlorthalidone (TENORETIC) 50-25 MG per tablet Take 1 tablet by mouth daily.      . metFORMIN (GLUCOPHAGE) 500 MG tablet Take 500 mg by mouth 2 (two) times daily with a meal.        . methocarbamol (ROBAXIN) 500 MG tablet Take 1 tablet (500 mg total) by  mouth 2 (two) times daily as needed for muscle spasms.  20 tablet  0  . naproxen (NAPROSYN) 500 MG tablet Take 1 tablet (500 mg total) by mouth 2 (two) times daily with a meal.  30 tablet  0  . oxyCODONE-acetaminophen (PERCOCET) 5-325 MG per tablet Take 1 tablet by mouth every 4 (four) hours as needed.  20 tablet  0   No current facility-administered medications on file prior to visit.    Review of Systems:  As per HPI, otherwise negative.    Physical Examination: Filed Vitals:   07/24/14 1158  BP: 140/88  Pulse: 60  Temp: 98.1 F (36.7 C)  Resp: 16   Filed Vitals:   07/24/14 1158  Height: 5' 6.25" (1.683 m)  Weight: 189 lb 6.4 oz (85.911 kg)   Body mass index is 30.33 kg/(m^2). Ideal Body Weight: Weight in (lb) to have BMI = 25: 155.7   GEN: WDWN, NAD, Non-toxic, Alert & Oriented x 3 HEENT: Atraumatic, Normocephalic.  Ears and Nose: No external deformity. EXTR: No clubbing/cyanosis/edema NEURO: Normal gait.  PSYCH: Normally interactive. Conversant. Not depressed or anxious appearing.  Calm demeanor.    Assessment and Plan: Back pain Patient instructed to obtain the FMLA form from he who restricts his activities for THAT event, not a prn.  Signed,  Ellison Carwin, MD

## 2015-02-22 ENCOUNTER — Emergency Department (HOSPITAL_COMMUNITY)
Admission: EM | Admit: 2015-02-22 | Discharge: 2015-02-22 | Disposition: A | Discharge status: Home / Self Care | Payer: Commercial Managed Care - PPO | Attending: Emergency Medicine | Admitting: Emergency Medicine

## 2015-02-22 ENCOUNTER — Encounter (HOSPITAL_COMMUNITY): Payer: Self-pay | Admitting: Emergency Medicine

## 2015-02-22 DIAGNOSIS — Z720 Tobacco use: Secondary | ICD-10-CM | POA: Insufficient documentation

## 2015-02-22 DIAGNOSIS — F1210 Cannabis abuse, uncomplicated: Secondary | ICD-10-CM | POA: Insufficient documentation

## 2015-02-22 DIAGNOSIS — Z79899 Other long term (current) drug therapy: Secondary | ICD-10-CM | POA: Insufficient documentation

## 2015-02-22 DIAGNOSIS — R42 Dizziness and giddiness: Secondary | ICD-10-CM | POA: Diagnosis present

## 2015-02-22 DIAGNOSIS — E119 Type 2 diabetes mellitus without complications: Secondary | ICD-10-CM | POA: Diagnosis not present

## 2015-02-22 DIAGNOSIS — I10 Essential (primary) hypertension: Secondary | ICD-10-CM | POA: Diagnosis not present

## 2015-02-22 DIAGNOSIS — R55 Syncope and collapse: Secondary | ICD-10-CM

## 2015-02-22 LAB — COMPREHENSIVE METABOLIC PANEL WITH GFR
ALT: 23 U/L (ref 17–63)
AST: 31 U/L (ref 15–41)
Albumin: 4.3 g/dL (ref 3.5–5.0)
Alkaline Phosphatase: 66 U/L (ref 38–126)
Anion gap: 11 (ref 5–15)
BUN: 11 mg/dL (ref 6–20)
CO2: 25 mmol/L (ref 22–32)
Calcium: 9.4 mg/dL (ref 8.9–10.3)
Chloride: 106 mmol/L (ref 101–111)
Creatinine, Ser: 0.89 mg/dL (ref 0.61–1.24)
GFR calc Af Amer: >60 mL/min
GFR calc non Af Amer: >60 mL/min
Glucose, Bld: 136 mg/dL — ABNORMAL HIGH (ref 70–99)
Potassium: 3.2 mmol/L — ABNORMAL LOW (ref 3.5–5.1)
Sodium: 142 mmol/L (ref 135–145)
Total Bilirubin: 1.1 mg/dL (ref 0.3–1.2)
Total Protein: 6.9 g/dL (ref 6.5–8.1)

## 2015-02-22 LAB — CBC WITH DIFFERENTIAL/PLATELET
Basophils Absolute: 0.1 10*3/uL (ref 0.0–0.1)
Basophils Relative: 1 % (ref 0–1)
Eosinophils Absolute: 0.1 10*3/uL (ref 0.0–0.7)
Eosinophils Relative: 1 % (ref 0–5)
HEMATOCRIT: 42.2 % (ref 39.0–52.0)
Hemoglobin: 14.2 g/dL (ref 13.0–17.0)
Lymphocytes Relative: 46 % (ref 12–46)
Lymphs Abs: 2.7 10*3/uL (ref 0.7–4.0)
MCH: 26.2 pg (ref 26.0–34.0)
MCHC: 33.6 g/dL (ref 30.0–36.0)
MCV: 77.9 fL — ABNORMAL LOW (ref 78.0–100.0)
MONOS PCT: 9 % (ref 3–12)
Monocytes Absolute: 0.6 10*3/uL (ref 0.1–1.0)
NEUTROS PCT: 43 % (ref 43–77)
Neutro Abs: 2.7 10*3/uL (ref 1.7–7.7)
PLATELETS: 229 10*3/uL (ref 150–400)
RBC: 5.42 MIL/uL (ref 4.22–5.81)
RDW: 14.4 % (ref 11.5–15.5)
WBC: 6.2 10*3/uL (ref 4.0–10.5)

## 2015-02-22 LAB — RAPID URINE DRUG SCREEN, HOSP PERFORMED
Amphetamines: NOT DETECTED
BARBITURATES: NOT DETECTED
BENZODIAZEPINES: NOT DETECTED
Cocaine: NOT DETECTED
OPIATES: NOT DETECTED
Tetrahydrocannabinol: POSITIVE — AB

## 2015-02-22 LAB — URINALYSIS, ROUTINE W REFLEX MICROSCOPIC
Bilirubin Urine: NEGATIVE
GLUCOSE, UA: NEGATIVE mg/dL
HGB URINE DIPSTICK: NEGATIVE
Ketones, ur: NEGATIVE mg/dL
Leukocytes, UA: NEGATIVE
Nitrite: NEGATIVE
PH: 7 (ref 5.0–8.0)
Protein, ur: NEGATIVE mg/dL
SPECIFIC GRAVITY, URINE: 1.027 (ref 1.005–1.030)
Urobilinogen, UA: 0.2 mg/dL (ref 0.0–1.0)

## 2015-02-22 LAB — I-STAT TROPONIN, ED: Troponin i, poc: 0 ng/mL (ref 0.00–0.08)

## 2015-02-22 LAB — CBG MONITORING, ED: Glucose-Capillary: 123 mg/dL — ABNORMAL HIGH (ref 70–99)

## 2015-02-22 MED ORDER — SODIUM CHLORIDE 0.9 % IV SOLN: 1000.0000 mL | Freq: Once | INTRAVENOUS | Status: DC

## 2015-02-22 MED ORDER — SODIUM CHLORIDE 0.9 % IV SOLN: 1000.0000 mL | INTRAVENOUS | Status: DC

## 2015-02-22 MED ORDER — POTASSIUM CHLORIDE CRYS ER 20 MEQ PO TBCR
40.0000 meq | EXTENDED_RELEASE_TABLET | Freq: Once | ORAL | Status: AC
  Administered 2015-02-22: 40 meq via ORAL
  Filled 2015-02-22: qty 2

## 2015-02-22 NOTE — ED Notes (Signed)
Per EMS pt comes from work for dizziness.  Pt has been fasting (for personal reasons that started today so hasnt ate anything but has drank tea) then while at work felt dizzy and went to medics office and was given few crackers.  Pt denies n/v. Pt states that he has sleep apnea.  Vitals: 184/108, 16RR, 100% RA, CBG 136

## 2015-02-22 NOTE — ED Notes (Signed)
Bed: WA16 Expected date:  Expected time:  Means of arrival:  Comments: EMS 

## 2015-02-22 NOTE — Discharge Instructions (Signed)
1. Medications: usual home medications 2. Treatment: rest, drink plenty of fluids, eat 3 meals per day 3. Follow Up: Please followup with your primary doctor as soon as possible for discussion of your diagnoses and further evaluation after today's visit; if you do not have a primary care doctor use the resource guide provided to find one; Please return to the ER for return of symptoms, new symptoms or other concerns.      Near-Syncope Near-syncope (commonly known as near fainting) is sudden weakness, dizziness, or feeling like you might pass out. During an episode of near-syncope, you may also develop pale skin, have tunnel vision, or feel sick to your stomach (nauseous). Near-syncope may occur when getting up after sitting or while standing for a long time. It is caused by a sudden decrease in blood flow to the brain. This decrease can result from various causes or triggers, most of which are not serious. However, because near-syncope can sometimes be a sign of something serious, a medical evaluation is required. The specific cause is often not determined. HOME CARE INSTRUCTIONS  Monitor your condition for any changes. The following actions may help to alleviate any discomfort you are experiencing:  Have someone stay with you until you feel stable.  Lie down right away and prop your feet up if you start feeling like you might faint. Breathe deeply and steadily. Wait until all the symptoms have passed. Most of these episodes last only a few minutes. You may feel tired for several hours.   Drink enough fluids to keep your urine clear or pale yellow.   If you are taking blood pressure or heart medicine, get up slowly when seated or lying down. Take several minutes to sit and then stand. This can reduce dizziness.  Follow up with your health care provider as directed. SEEK IMMEDIATE MEDICAL CARE IF:   You have a severe headache.   You have unusual pain in the chest, abdomen, or back.    You are bleeding from the mouth or rectum, or you have black or tarry stool.   You have an irregular or very fast heartbeat.   You have repeated fainting or have seizure-like jerking during an episode.   You faint when sitting or lying down.   You have confusion.   You have difficulty walking.   You have severe weakness.   You have vision problems.  MAKE SURE YOU:   Understand these instructions.  Will watch your condition.  Will get help right away if you are not doing well or get worse. Document Released: 10/02/2005 Document Revised: 10/07/2013 Document Reviewed: 03/07/2013 [facility] Patient Information 2015 [place], [place]. This information is not intended to replace advice given to you by your health care provider. Make sure you discuss any questions you have with your health care provider.

## 2015-02-22 NOTE — ED Notes (Signed)
Pt finished first cup water, now onto second cup water.

## 2015-02-22 NOTE — ED Provider Notes (Signed)
CSN: [id number]     Arrival date & time 02/22/15  1552 History   First MD Initiated Contact with Patient 02/22/15 1552     Chief Complaint  Patient presents with  . Dizziness     (Consider location/radiation/quality/duration/timing/severity/associated sxs/prior Treatment) The history is provided by the patient and medical records. No language interpreter was used.     Evan Estes is a 54 y.o. 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  Patient reports that he became hot, lightheaded and felt like he might pass out however he did not have a full syncopal episode, fall or hit his head.  He denies chest pain, shortness of breath, nausea or vomiting in association with this episode. He reports he felt some better after taking some water and eating several crackers. He reports that he is currently fasting and has been doing so for approximately 12 hours out of each day for the last 1 month. He reports a 20 pound weight loss in the last month due to this. He reports that normally he drinks water however today he was drinking green tea.  Patient reports that he has had no nutrient intake since late last night. He reports intermittent alcohol usage including this weekend. He reports recreational drug usage however does not discuss which types.  Denies drug usage today. Patient is in everyday smoker.  He denies fever, chills, headache, neck pain, neck stiffness, chest pain, shortness of breath, abdominal pain, nausea, vomiting, diarrhea, dysuria, hematuria.  Past Medical History  Diagnosis Date  . Hypertension   . Diabetes mellitus    Past Surgical History  Procedure Laterality Date  . Colonoscopy  09/27/2011    Procedure: COLONOSCOPY;  Surgeon: Rogene Houston, MD;  Location: [facility];  Service:  Endoscopy;  Laterality: N/A;  10:45   Family History  Problem Relation Age of Onset  . Anesthesia problems Neg Hx    History  Substance Use Topics  . Smoking status: Current Every Day Smoker    Types: Cigars  . Smokeless tobacco: Not on file  . Alcohol Use: Yes     Comment: occasional    Review of Systems  Constitutional: Negative for fever, diaphoresis, appetite change, fatigue and unexpected weight change.  HENT: Negative for mouth sores.   Eyes: Negative for visual disturbance.  Respiratory: Negative for cough, chest tightness, shortness of breath and wheezing.   Cardiovascular: Negative for chest pain.  Gastrointestinal: Negative for nausea, vomiting, abdominal pain, diarrhea and constipation.  Endocrine: Negative for polydipsia, polyphagia and polyuria.  Genitourinary: Negative for dysuria, urgency, frequency and hematuria.  Musculoskeletal: Negative for back pain and neck stiffness.  Skin: Negative for rash.  Allergic/Immunologic: Negative for immunocompromised state.  Neurological: Positive for syncope (near syncope) and light-headedness. Negative for headaches.  Hematological: Does not bruise/bleed easily.  Psychiatric/Behavioral: Negative for sleep disturbance. The patient is not nervous/anxious.       Allergies  Review of patient's allergies indicates no known allergies.  Home Medications   Prior to Admission medications   Medication Sig Start Date End Date Taking? Authorizing Provider  atenolol-chlorthalidone (TENORETIC) 50-25 MG per tablet Take 1 tablet by mouth daily.   Yes Historical Provider, MD  glipiZIDE (GLUCOTROL) 10 MG tablet Take 10 mg by mouth 2 (two) times daily.   Yes Historical Provider, MD  lisinopril (PRINIVIL,ZESTRIL) 10 MG tablet Take 10 mg by mouth daily.   Yes Historical Provider, MD  lovastatin (MEVACOR) 20 MG tablet Take 20 mg by mouth every morning.   Yes Historical Provider, MD  metFORMIN (GLUCOPHAGE) 500 MG tablet Take 500 mg by mouth  daily.    Yes Historical Provider, MD  oxyCODONE-acetaminophen (PERCOCET) 5-325 MG per tablet Take 1 tablet by mouth every 4 (four) hours as needed. Patient taking differently: Take 1 tablet by mouth every 4 (four) hours as needed for moderate pain.  06/21/14  Yes Noemi Chapel, MD  methocarbamol (ROBAXIN) 500 MG tablet Take 1 tablet (500 mg total) by mouth 2 (two) times daily as needed for muscle spasms. Patient not taking: Reported on 02/22/2015 06/21/14   Noemi Chapel, MD  naproxen (NAPROSYN) 500 MG tablet Take 1 tablet (500 mg total) by mouth 2 (two) times daily with a meal. Patient not taking: Reported on 02/22/2015 06/21/14   Noemi Chapel, MD   BP 140/81 mmHg  Pulse 48  Temp(Src) 97.8 F (36.6 C) (Oral)  Resp 14  SpO2 96% Physical Exam  Constitutional: He is oriented to person, place, and time. He appears well-developed and well-nourished. No distress.  Awake, alert, nontoxic appearance  HENT:  Head: Normocephalic and atraumatic.  Mouth/Throat: Oropharynx is clear and moist. No oropharyngeal exudate.  Eyes: Conjunctivae and EOM are normal. Pupils are equal, round, and reactive to light. No scleral icterus.  No horizontal, vertical or rotational nystagmus  Neck: Normal range of motion. Neck supple.  Full active and passive ROM without pain No midline or paraspinal tenderness No nuchal rigidity or meningeal signs  Cardiovascular: Normal rate, regular rhythm, normal heart sounds and intact distal pulses.   No murmur heard. Pulmonary/Chest: Effort normal and breath sounds normal. No respiratory distress. He has no wheezes. He has no rales.  Equal chest expansion  Abdominal: Soft. Bowel sounds are normal. He exhibits no mass. There is no tenderness. There is no rebound and no guarding.  Musculoskeletal: Normal range of motion. He exhibits no edema.  Lymphadenopathy:    He has no cervical adenopathy.  Neurological: He is alert and oriented to person, place, and time. He has normal reflexes. No  cranial nerve deficit. He exhibits normal muscle tone. Coordination normal.  Mental Status:  Alert, oriented, thought content appropriate. Speech fluent without evidence of aphasia. Able to follow 2 step commands without difficulty.  Cranial Nerves:  II:  Peripheral visual fields grossly normal, pupils equal, round, reactive to light III,IV, VI: ptosis not present, extra-ocular motions intact bilaterally  V,VII: smile symmetric, facial light touch sensation equal VIII: hearing grossly normal bilaterally  IX,X: gag reflex present  XI: bilateral shoulder shrug equal and strong XII: midline tongue extension  Motor:  5/5 in upper and lower extremities bilaterally including strong and equal grip strength and dorsiflexion/plantar flexion Sensory: Pinprick and light touch normal in all extremities.  Deep Tendon Reflexes: 2+ and symmetric  Cerebellar: normal finger-to-nose with bilateral upper extremities Gait: normal gait and balance CV: distal pulses palpable throughout   Skin: Skin is warm and dry. No rash noted. He is not diaphoretic.  Psychiatric: He has a normal mood and affect. His behavior is normal. Judgment and thought content normal.  Nursing note and vitals reviewed.   ED Course  Procedures (including critical care time) Labs Review Labs Reviewed  CBC WITH DIFFERENTIAL/PLATELET - Abnormal; Notable for the following:    MCV 77.9 (*)    All other components within normal  limits  COMPREHENSIVE METABOLIC PANEL - Abnormal; Notable for the following:    Potassium 3.2 (*)    Glucose, Bld 136 (*)    All other components within normal limits  URINE RAPID DRUG SCREEN (HOSP PERFORMED) - Abnormal; Notable for the following:    Tetrahydrocannabinol POSITIVE (*)    All other components within normal limits  CBG MONITORING, ED - Abnormal; Notable for the following:    Glucose-Capillary 123 (*)    All other components within normal limits  URINALYSIS, ROUTINE W REFLEX MICROSCOPIC  POCT  CBG (FASTING - GLUCOSE)-MANUAL ENTRY  I-STAT TROPOININ, ED    Imaging Review No results found.   EKG Interpretation   Date/Time:  Monday Feb 22 2015 16:04:50 EDT Ventricular Rate:  52 PR Interval:  178 QRS Duration: 90 QT Interval:  464 QTC Calculation: 431 R Axis:   2 Text Interpretation:  Sinus rhythm Abnormal R-wave progression, early  transition Nonspecific T abnormalities, inferior leads No significant  change since last tracing Confirmed by Zenia Resides  MD, ANTHONY ([id number]) on  02/22/2015 4:21:46 PM      MDM   Final diagnoses:  Near syncope   Sherryl Manges presents emergency department after near syncopal episode. Patient has been fasting.  No evidence of hypoglycemia. No tachycardia, fever or hypotension.  Patient declines IV fluids at this time. Will by mouth challenge with fluids for the next hour or so while awaiting blood work.  Reports resolution of symptoms at this time.  Orthostatic VS for the past 24 hrs:  BP- Lying Pulse- Lying BP- Sitting Pulse- Sitting BP- Standing at 0 minutes Pulse- Standing at 0 minutes  02/22/15 1623 171/84 mmHg 54 (!) 179/94 mmHg 55 (!) 159/96 mmHg 64    7:32 PM Patient without arrhythmia or tachycardia while here in the department.  Reports he is feeling normal. Has been ambulatory without difficulty. Patient without history of congestive heart failure, normal hematocrit, normal ECG, no shortness of breath and systolic blood pressure greater than 90; patient is low risk. Will plan for discharge home with close PCP follow-up.  Possibility of recurrent symptoms has been discussed. I discussed reasons to avoid driving until PCP followup and other safety preventions including use of ladders and working at heights. I also discussed cessation of his fasting until follow-up.    Pt has remained hemodynamically stable throughout their time in the ED  BP 140/81 mmHg  Pulse 48  Temp(Src) 97.8 F (36.6 C) (Oral)  Resp 14  SpO2 96%    Abigail Butts, PA-C 02/22/15 1936  Lacretia Leigh, MD 02/28/15 1053

## 2015-02-22 NOTE — ED Notes (Signed)
Pt refused IV stick or IV fluids, but would allow RN to stick for blood draw.

## 2015-05-15 IMAGING — CT CT HEAD W/O CM
1 series · 16 of 30 positions shown, 20 images · non-contrast
Comparison: None.

CLINICAL DATA: Hypertension, headache, heart palpitations.

EXAM:
CT HEAD WITHOUT CONTRAST
TECHNIQUE: Contiguous axial images were obtained from the base of the skull
through the vertex without intravenous contrast.

[Series 2: headseq 4.8 h37s · axial · 0.47mm/px · z∈[+105,+263]mm · 16 of 36 slices shown, 20 images]
[im 2/36  brain]
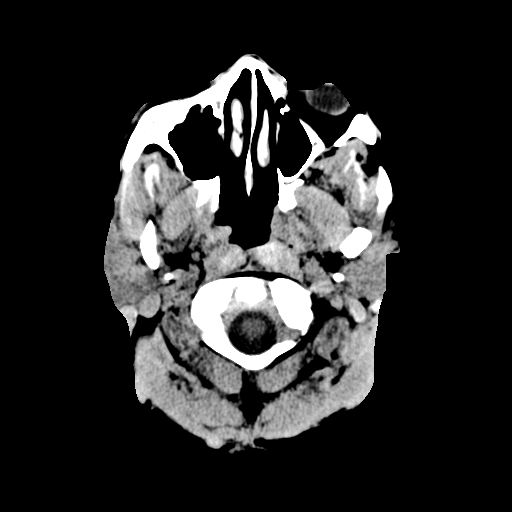
[im 2/36  bone]
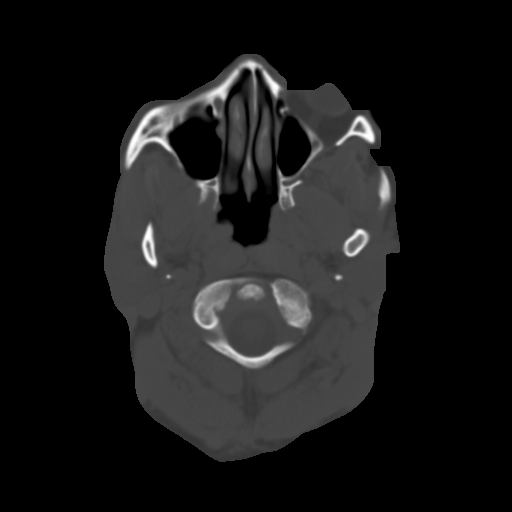
[im 4/36  brain]
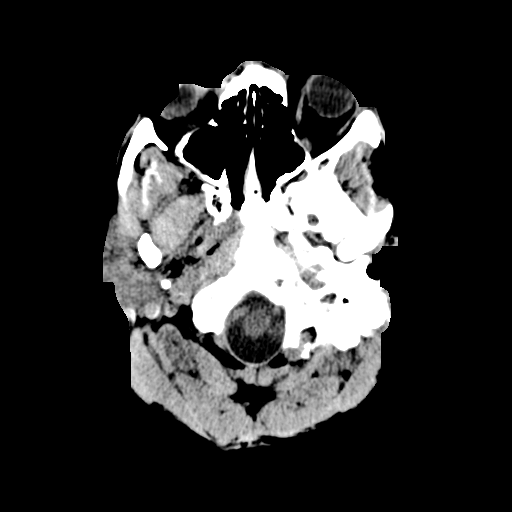
[im 7/36  brain]
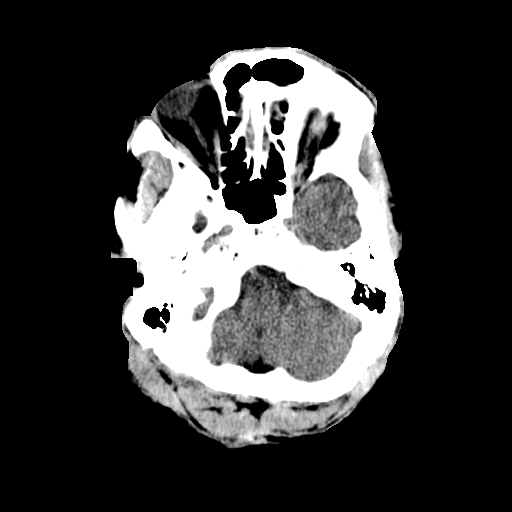
[im 9/36  brain]
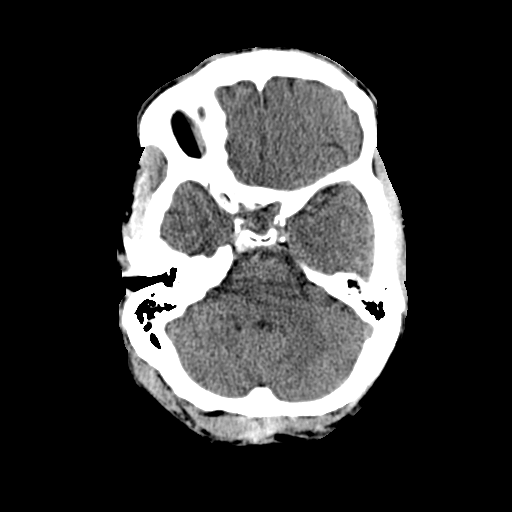
[im 10/36  brain]
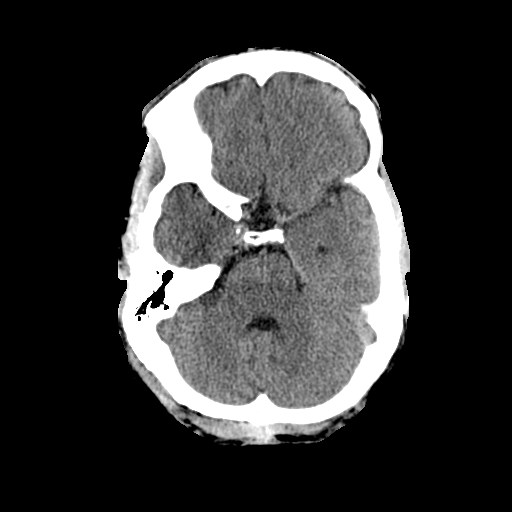
[im 10/36  bone]
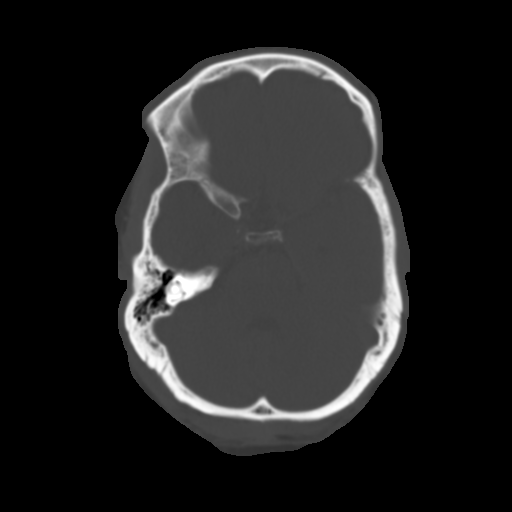
[im 13/36  brain]
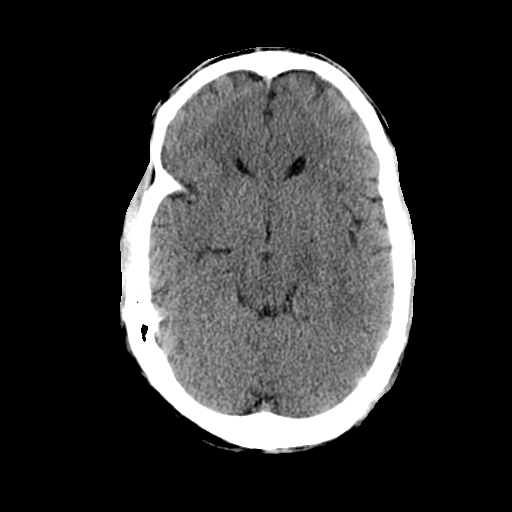
[im 15/36  brain]
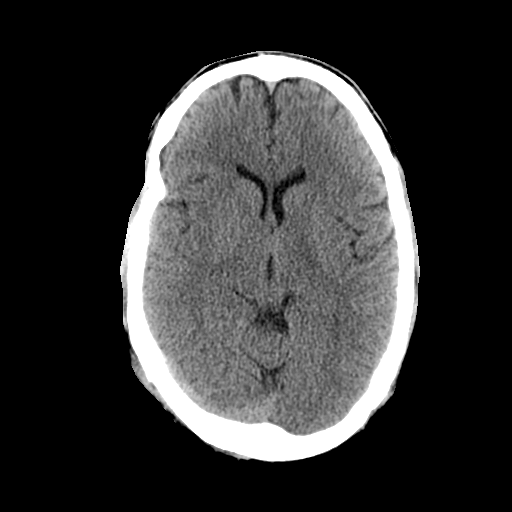
[im 17/36  brain]
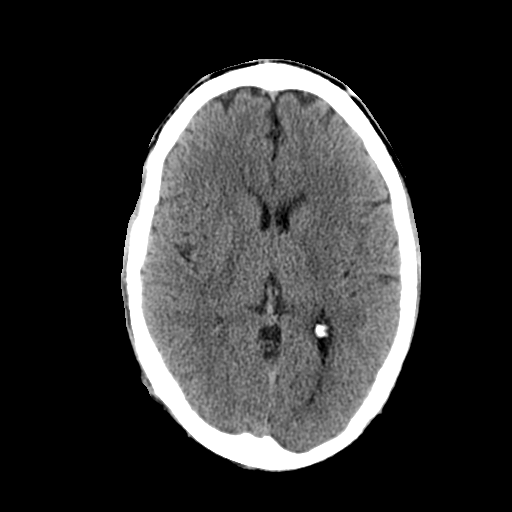
[im 19/36  brain]
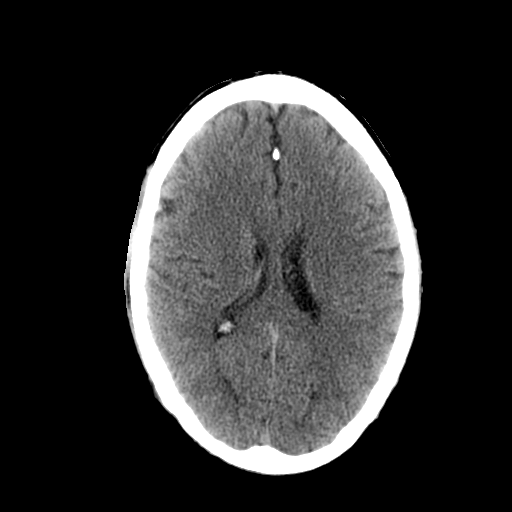
[im 19/36  bone]
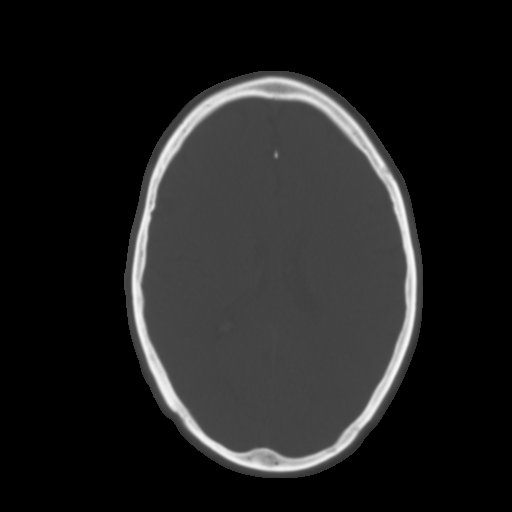
[im 21/36  brain]
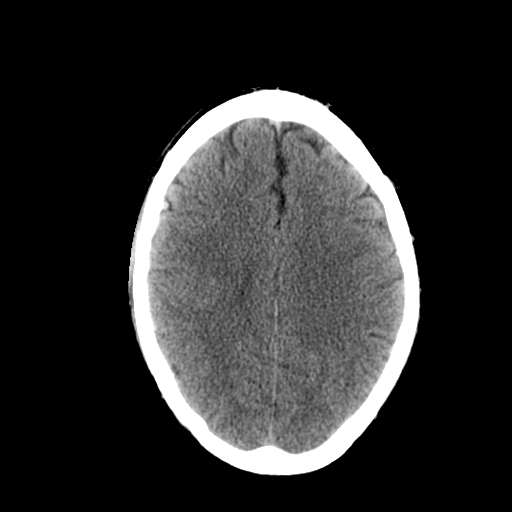
[im 23/36  brain]
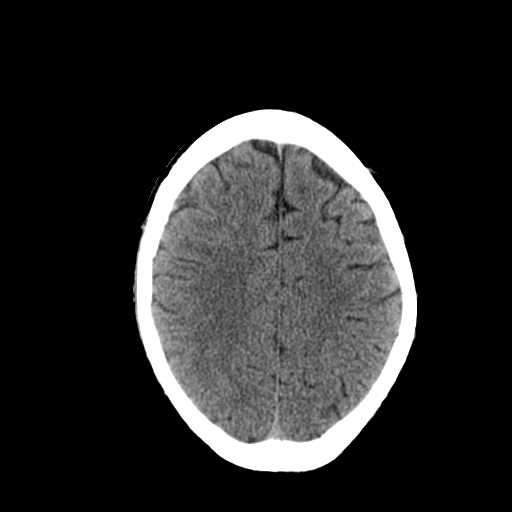
[im 26/36  brain]
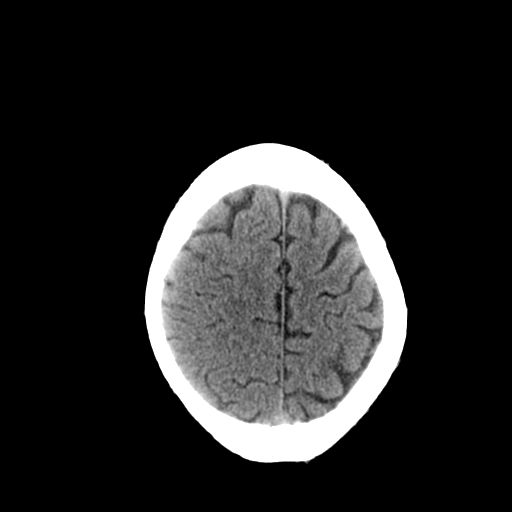
[im 27/36  brain]
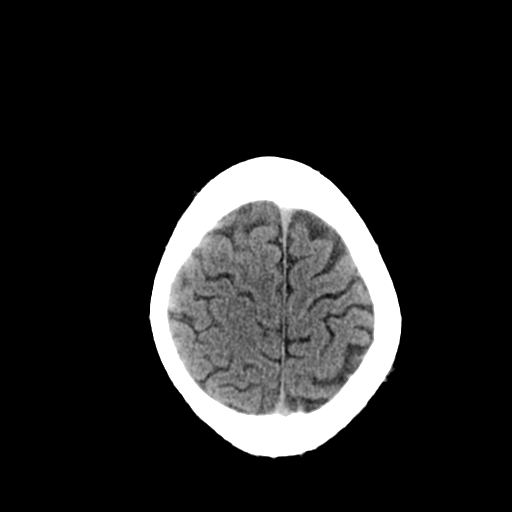
[im 27/36  bone]
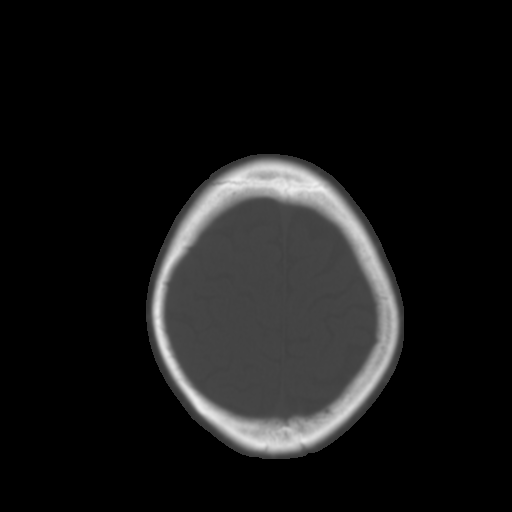
[im 29/36  brain]
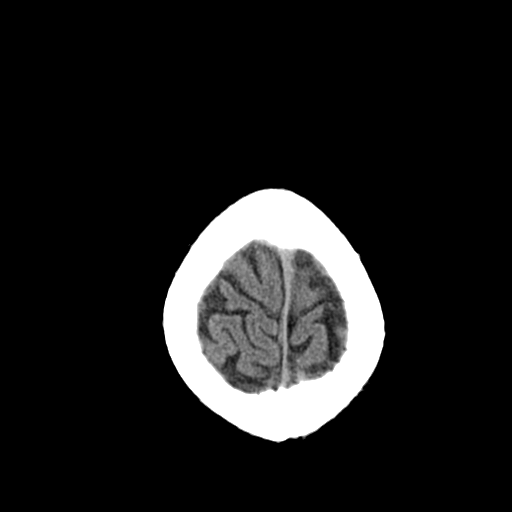
[im 32/36  brain]
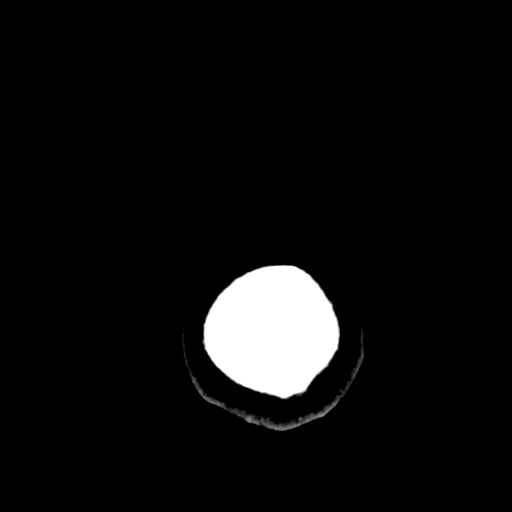
[im 34/36  brain]
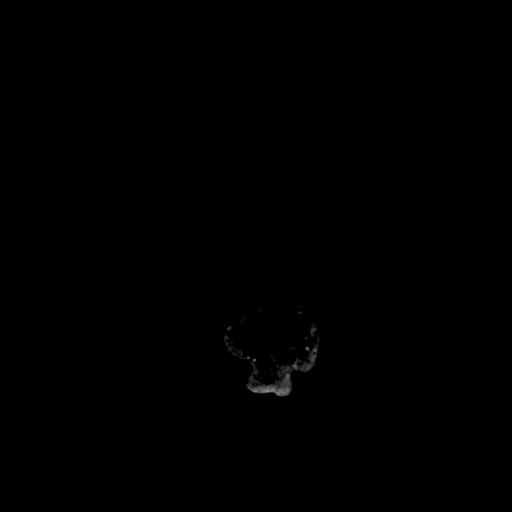

[16 of 30 positions shown; findings below may reference images not displayed]

FINDINGS: Ventricles and sulci appear symmetrical. No mass effect or midline
shift. No abnormal extra-axial fluid collections. Gray-white matter
junctions are distinct. Basal cisterns are not effaced. No evidence
of acute intracranial hemorrhage. No depressed skull fractures.
Visualized paranasal sinuses and mastoid air cells are not
opacified.
IMPRESSION: No acute intracranial abnormalities.

## 2015-05-15 IMAGING — CR DG CHEST 2V
2 series · 2 of 2 positions shown · non-contrast
Comparison: None.

CLINICAL DATA: Short of breath

EXAM:
CHEST - 2 VIEW

[view not recorded (1 of 2)]
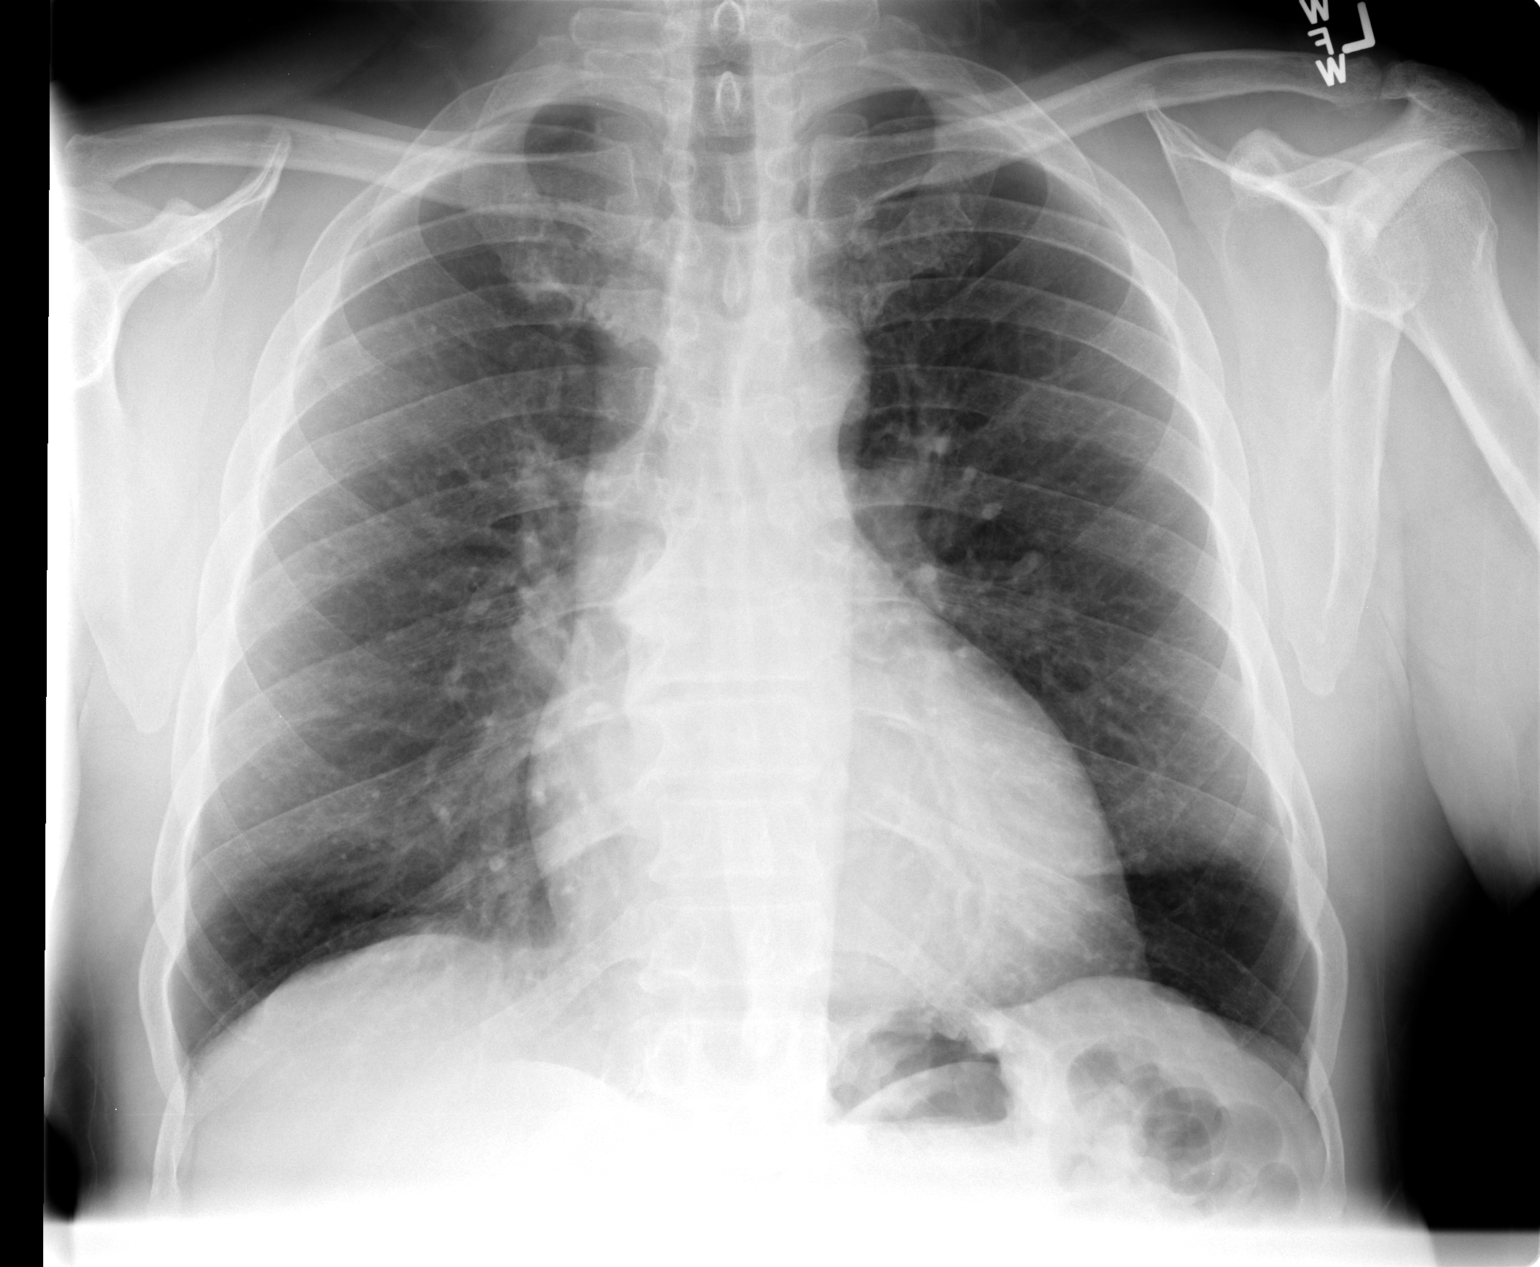

[view not recorded (2 of 2)]
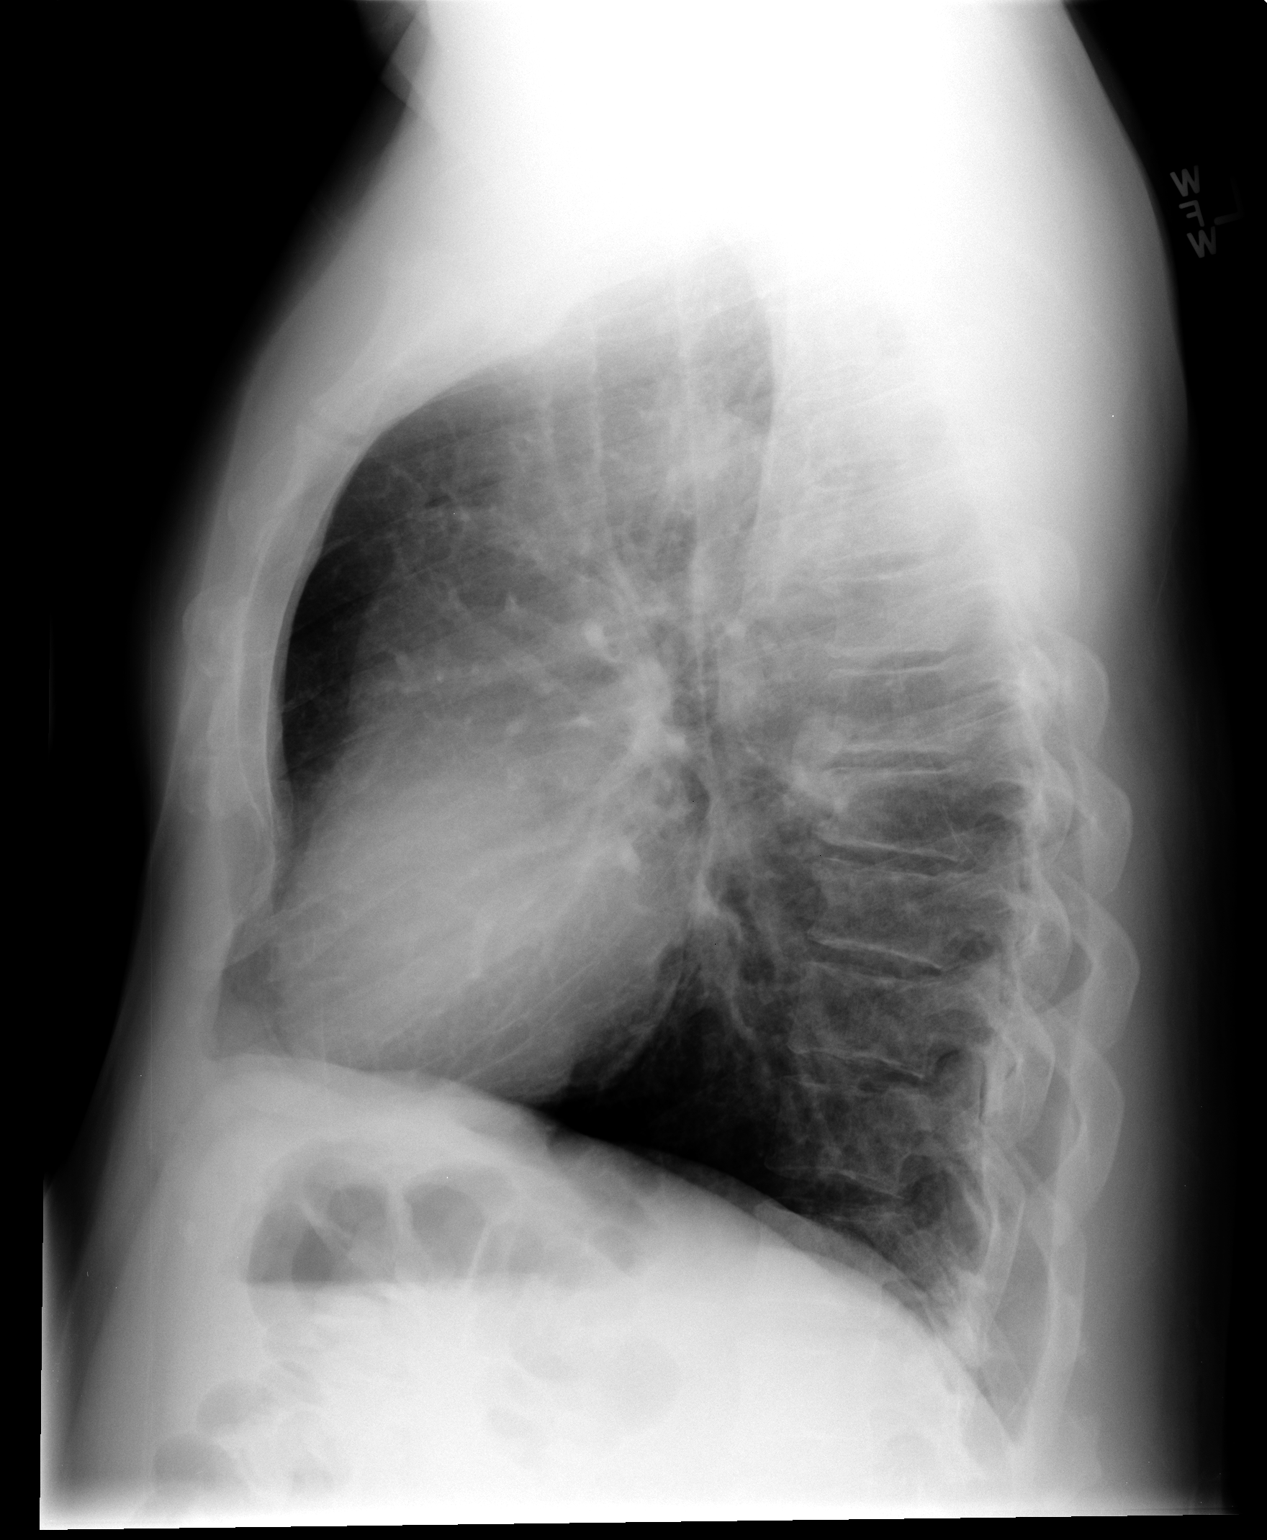

[2 of 2 positions shown; findings below may reference images not displayed]

FINDINGS: Normal heart size. Clear lungs. Mild hyperaeration. No pneumothorax
or pleural effusion.
IMPRESSION: No active cardiopulmonary disease.

## 2016-03-15 ENCOUNTER — Other Ambulatory Visit (HOSPITAL_COMMUNITY): Payer: Self-pay | Admitting: Respiratory Therapy

## 2016-03-15 DIAGNOSIS — G4733 Obstructive sleep apnea (adult) (pediatric): Secondary | ICD-10-CM

## 2016-04-14 ENCOUNTER — Ambulatory Visit: Payer: Commercial Managed Care - PPO | Attending: Neurology | Admitting: Neurology

## 2016-04-14 DIAGNOSIS — G4733 Obstructive sleep apnea (adult) (pediatric): Secondary | ICD-10-CM

## 2016-04-23 NOTE — Procedures (Signed)
  [facility] A. Merlene Laughter, MD     [email or web address]             NOCTURNAL POLYSOMNOGRAPHY   LOCATION: [facility]  Patient Name: Evan Estes, Evan Estes Date: 04/14/2016 Gender: Male D.O.B: 1960/10/25 Age (years): 38 Referring Provider: Not Available Height (inches): 69 Interpreting Physician: Phillips Odor MD, ABSM Weight (lbs): 190 RPSGT: Peak, Robert BMI: 28 MRN: [id number] Neck Size: 16.00 CLINICAL INFORMATION Sleep Study Type: NPSG Indication for sleep study: N/A Epworth Sleepiness Score: 11 SLEEP STUDY TECHNIQUE As per the AASM Manual for the Scoring of Sleep and Associated Events v2.3 (April 2016) with a hypopnea requiring 4% desaturations. The channels recorded and monitored were frontal, central and occipital EEG, electrooculogram (EOG), submentalis EMG (chin), nasal and oral airflow, thoracic and abdominal wall motion, anterior tibialis EMG, snore microphone, electrocardiogram, and pulse oximetry. MEDICATIONS Patient's medications include: N/A. Medications self-administered by patient during sleep study : No sleep medicine administered.  Current outpatient prescriptions:  .  atenolol-chlorthalidone (TENORETIC) 50-25 MG per tablet, Take 1 tablet by mouth daily., Disp: , Rfl:  .  glipiZIDE (GLUCOTROL) 10 MG tablet, Take 10 mg by mouth 2 (two) times daily., Disp: , Rfl:  .  lisinopril (PRINIVIL,ZESTRIL) 10 MG tablet, Take 10 mg by mouth daily., Disp: , Rfl:  .  lovastatin (MEVACOR) 20 MG tablet, Take 20 mg by mouth every morning., Disp: , Rfl:  .  metFORMIN (GLUCOPHAGE) 500 MG tablet, Take 500 mg by mouth daily. , Disp: , Rfl:  .  methocarbamol (ROBAXIN) 500 MG tablet, Take 1 tablet (500 mg total) by mouth 2 (two) times daily as needed for muscle spasms. (Patient not taking: Reported on 02/22/2015), Disp: 20 tablet, Rfl: 0 .  naproxen (NAPROSYN) 500 MG tablet, Take 1 tablet (500 mg total) by mouth 2 (two) times daily with a meal. (Patient not taking: Reported  on 02/22/2015), Disp: 30 tablet, Rfl: 0 .  oxyCODONE-acetaminophen (PERCOCET) 5-325 MG per tablet, Take 1 tablet by mouth every 4 (four) hours as needed. (Patient taking differently: Take 1 tablet by mouth every 4 (four) hours as needed for moderate pain. ), Disp: 20 tablet, Rfl: 0  SLEEP ARCHITECTURE The study was initiated at 9:53:48 PM and ended at 4:59:01 AM. Sleep onset time was 23.5 minutes and the sleep efficiency was 83.4%. The total sleep time was 354.5 minutes. Stage REM latency was 30.5 minutes. The patient spent 16.08% of the night in stage N1 sleep, 58.67% in stage N2 sleep, 0.00% in stage N3 and 25.25% in REM. Alpha intrusion was absent. Supine sleep was 25.81%. RESPIRATORY PARAMETERS The overall apnea/hypopnea index (AHI) was 19.8 per hour. There were 36 total apneas, including 22 obstructive, 12 central and 2 mixed apneas. There were 81 hypopneas and 58 RERAs. The AHI during Stage REM sleep was 38.2 per hour. AHI while supine was 24.3 per hour. The mean oxygen saturation was 94.13%. The minimum SpO2 during sleep was 87.00%. Loud snoring was noted during this study. CARDIAC DATA The 2 lead EKG demonstrated sinus rhythm. The mean heart rate was 60.67 beats per minute. Other EKG findings include: PVCs. LEG MOVEMENT DATA The total PLMS were 0 with a resulting PLMS index of 0.00. Associated arousal with leg movement index was 0.0.   IMPRESSIONS - Moderate obstructive sleep apnea worse during REM sleep. Suggest formal CPAP titration.     Delano Metz, MD Diplomate, American Board of Sleep Medicine.

## 2020-07-29 ENCOUNTER — Emergency Department (HOSPITAL_COMMUNITY): Payer: BC Managed Care – PPO

## 2020-07-29 ENCOUNTER — Encounter (HOSPITAL_COMMUNITY): Payer: Self-pay | Admitting: Emergency Medicine

## 2020-07-29 ENCOUNTER — Other Ambulatory Visit: Payer: Self-pay

## 2020-07-29 ENCOUNTER — Emergency Department (HOSPITAL_COMMUNITY)
Admission: EM | Admit: 2020-07-29 | Discharge: 2020-07-29 | Disposition: A | Discharge status: Home / Self Care | Payer: BC Managed Care – PPO | Attending: Emergency Medicine | Admitting: Emergency Medicine

## 2020-07-29 DIAGNOSIS — Z87891 Personal history of nicotine dependence: Secondary | ICD-10-CM | POA: Diagnosis not present

## 2020-07-29 DIAGNOSIS — E119 Type 2 diabetes mellitus without complications: Secondary | ICD-10-CM | POA: Insufficient documentation

## 2020-07-29 DIAGNOSIS — Z79899 Other long term (current) drug therapy: Secondary | ICD-10-CM | POA: Insufficient documentation

## 2020-07-29 DIAGNOSIS — Z7984 Long term (current) use of oral hypoglycemic drugs: Secondary | ICD-10-CM | POA: Diagnosis not present

## 2020-07-29 DIAGNOSIS — I10 Essential (primary) hypertension: Secondary | ICD-10-CM | POA: Insufficient documentation

## 2020-07-29 DIAGNOSIS — S50812A Abrasion of left forearm, initial encounter: Secondary | ICD-10-CM | POA: Diagnosis not present

## 2020-07-29 MED ORDER — ACETAMINOPHEN 500 MG PO TABS
1000.0000 mg | ORAL_TABLET | Freq: Once | ORAL | Status: AC
  Administered 2020-07-29: 1000 mg via ORAL
  Filled 2020-07-29: qty 2

## 2020-07-29 NOTE — Discharge Instructions (Addendum)
As we discussed it is possible you have a concussion. You can treat your headache with over-the-counter medications such as tylenol as needed. Apply ice to your areas of pain for 20 minutes at a time. Stay hydrated and get plenty of rest. Limit your screen time and complex thinking. Avoid any contact sports/activities to prevent re-injury to your head. Follow up with your primary care provider in 1 week for re-check and to be cleared to return to normal activity. Return to the ER if you develop severely worsening headache, changes in your vision, persistent vomiting, or new or concerning symptoms.
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
## 2020-11-29 ENCOUNTER — Encounter (INDEPENDENT_AMBULATORY_CARE_PROVIDER_SITE_OTHER): Payer: Self-pay | Admitting: *Deleted

## 2020-12-06 ENCOUNTER — Other Ambulatory Visit: Payer: Self-pay | Admitting: Family Medicine

## 2020-12-06 DIAGNOSIS — K439 Ventral hernia without obstruction or gangrene: Secondary | ICD-10-CM

## 2020-12-21 ENCOUNTER — Ambulatory Visit: Payer: BC Managed Care – PPO | Admitting: General Surgery

## 2020-12-30 ENCOUNTER — Encounter: Payer: Self-pay | Admitting: General Surgery

## 2020-12-30 ENCOUNTER — Other Ambulatory Visit: Payer: Self-pay

## 2020-12-30 ENCOUNTER — Ambulatory Visit: Payer: BC Managed Care – PPO | Admitting: General Surgery

## 2020-12-30 VITALS — BP 143/75 | HR 48 | Temp 97.3°F | Resp 16 | Ht 66.0 in | Wt 173.0 lb

## 2020-12-30 DIAGNOSIS — R1900 Intra-abdominal and pelvic swelling, mass and lump, unspecified site: Secondary | ICD-10-CM | POA: Diagnosis not present

## 2020-12-31 NOTE — Progress Notes (Signed)
Evan Estes; [id number]; Jul 21, 1961   HPI Patient is a 60 year old black male who was referred to my care by Dr. Altha Harm for evaluation and treatment of a possible ventral hernia.  Patient states that he has a swelling in the epigastric region which occasionally is tender to touch.  He states sometimes it is made worse with eating or with movement.  He denies any nausea, vomiting, fever, or chills.  He states is been present for many months. Past Medical History:  Diagnosis Date  . Diabetes mellitus   . Hypertension     Past Surgical History:  Procedure Laterality Date  . COLONOSCOPY  09/27/2011   Procedure: COLONOSCOPY;  Surgeon: Rogene Houston, MD;  Location: [facility];  Service: Endoscopy;  Laterality: N/A;  10:45    Family History  Problem Relation Age of Onset  . Anesthesia problems Neg Hx     Current Outpatient Medications on File Prior to Visit  Medication Sig Dispense Refill  . atenolol-chlorthalidone (TENORETIC) 50-25 MG per tablet Take 1 tablet by mouth daily.    Marland Kitchen lovastatin (MEVACOR) 20 MG tablet Take 20 mg by mouth every morning.    . metFORMIN (GLUCOPHAGE) 500 MG tablet Take 500 mg by mouth 2 (two) times daily with a meal.    . olmesartan (BENICAR) 20 MG tablet Take 20 mg by mouth daily.     No current facility-administered medications on file prior to visit.    No Known Allergies  Social History   Substance and Sexual Activity  Alcohol Use Yes   Comment: occasional    Social History   Tobacco Use  Smoking Status Former Smoker  . Types: Cigars  Smokeless Tobacco Never Used  Tobacco Comment   quit about 2 years ago    Review of Systems  Constitutional: Negative.   HENT: Negative.   Eyes: Negative.   Respiratory: Negative.   Cardiovascular: Negative.   Gastrointestinal: Negative.   Genitourinary: Negative.   Musculoskeletal: Negative.   Skin:       Dry skin  Neurological: Positive for dizziness.  Endo/Heme/Allergies: Negative.    Psychiatric/Behavioral: Negative.     Objective   Vitals:   12/30/20 0958  BP: (!) 143/75  Pulse: (!) 48  Resp: 16  Temp: (!) 97.3 F (36.3 C)  SpO2: 97%    Physical Exam Vitals reviewed.  Constitutional:      Appearance: Normal appearance. He is normal weight. He is not ill-appearing.  HENT:     Head: Normocephalic and atraumatic.  Cardiovascular:     Rate and Rhythm: Normal rate and regular rhythm.     Heart sounds: Normal heart sounds. No murmur heard. No friction rub. No gallop.   Pulmonary:     Effort: Pulmonary effort is normal. No respiratory distress.     Breath sounds: Normal breath sounds. No stridor. No wheezing, rhonchi or rales.  Abdominal:     General: Bowel sounds are normal. There is no distension.     Palpations: Abdomen is soft. There is no mass.     Tenderness: There is no abdominal tenderness. There is no guarding or rebound.     Hernia: No hernia is present.     Comments: I asked the patient to the point the area that he is concerned about.  He points to the subxiphoid process.  I was not able to elicit a ventral hernia while lying down or straining.  Skin:    General: Skin is dry.  Neurological:  Mental Status: He is alert and oriented to person, place, and time.   Primary care notes reviewed  Assessment  Epigastric swelling is the subxiphoid process.  No hernia present. Plan   I showed the patient a picture of the subxiphoid process.  I have reassured him that he did not have a hernia and he was relieved.  Follow-up as needed.

## 2021-02-24 ENCOUNTER — Encounter (INDEPENDENT_AMBULATORY_CARE_PROVIDER_SITE_OTHER): Payer: Self-pay | Admitting: *Deleted

## 2021-09-12 ENCOUNTER — Encounter (INDEPENDENT_AMBULATORY_CARE_PROVIDER_SITE_OTHER): Payer: Self-pay | Admitting: *Deleted

## 2021-12-07 ENCOUNTER — Encounter (INDEPENDENT_AMBULATORY_CARE_PROVIDER_SITE_OTHER): Payer: Self-pay

## 2021-12-22 ENCOUNTER — Encounter: Payer: Self-pay | Admitting: Urology

## 2021-12-22 ENCOUNTER — Ambulatory Visit: Payer: BC Managed Care – PPO | Admitting: Urology

## 2021-12-22 ENCOUNTER — Other Ambulatory Visit: Payer: Self-pay

## 2021-12-22 VITALS — BP 151/72 | HR 59 | Wt 160.0 lb

## 2021-12-22 DIAGNOSIS — Z125 Encounter for screening for malignant neoplasm of prostate: Secondary | ICD-10-CM | POA: Diagnosis not present

## 2021-12-22 DIAGNOSIS — R351 Nocturia: Secondary | ICD-10-CM | POA: Diagnosis not present

## 2021-12-22 DIAGNOSIS — N400 Enlarged prostate without lower urinary tract symptoms: Secondary | ICD-10-CM

## 2021-12-22 NOTE — Progress Notes (Signed)
? ?Assessment: ?1. Prostate cancer screening   ?2. Nocturia   ?3. BPH without obstruction/lower urinary tract symptoms   ? ? ?Plan: ?The natural history of prostate cancer and ongoing controversy regarding screening and potential treatment outcomes of prostate cancer has been discussed with the patient. The meaning of a false positive PSA and a false negative PSA has been discussed. He indicates understanding of the limitations of this screening test and wishes  to proceed with screening PSA testing.  ?PSA today. ?Return to office in 1 year. ? ? ?Chief Complaint:  ?Chief Complaint  ?Patient presents with  ? Prostate Check  ? ? ?History of Present Illness: ? ?Evan Estes is a 61 y.o. year old male who is seen in consultation from [place], [place], PA-C ? for evaluation of prostate cancer screening.  He has not had any recent PSA testing.  No prior history of elevated PSA.  No history of UTIs or prostatitis.  He has nocturia x3 but no other lower urinary tract symptoms.  No dysuria or gross hematuria.  No family history of prostate cancer.  He is interested in prostate cancer screening. ?IPSS = 3 today. ? ? ?Past Medical History:  ?Past Medical History:  ?Diagnosis Date  ? Diabetes mellitus   ? Hypertension   ? ? ?Past Surgical History:  ?Past Surgical History:  ?Procedure Laterality Date  ? COLONOSCOPY  09/27/2011  ? Procedure: COLONOSCOPY;  Surgeon: Rogene Houston, MD;  Location[facility];  Service: Endoscopy;  Laterality: N/A;  10:45  ? ? ?Allergies:  ?No Known Allergies ? ?Family History:  ?Family History  ?Problem Relation Age of Onset  ? Anesthesia problems Neg Hx   ? ? ?Social History:  ?Social History  ? ?Tobacco Use  ? Smoking status: Former  ?  Types: Cigars  ? Smokeless tobacco: Never  ? Tobacco comments:  ?  quit about 2 years ago  ?Substance Use Topics  ? Alcohol use: Yes  ?  Comment: occasional  ? Drug use: Yes  ? ? ?Review of symptoms:  ?Constitutional:  Negative for unexplained weight loss,  night sweats, fever, chills ?ENT:  Negative for nose bleeds, sinus pain, painful swallowing ?CV:  Negative for chest pain, shortness of breath, exercise intolerance, palpitations, loss of consciousness ?Resp:  Negative for cough, wheezing, shortness of breath ?GI:  Negative for nausea, vomiting, diarrhea, bloody stools ?GU:  Positives noted in HPI; otherwise negative for gross hematuria, dysuria, urinary incontinence ?Neuro:  Negative for seizures, poor balance, limb weakness, slurred speech ?Psych:  Negative for lack of energy, depression, anxiety ?Endocrine:  Negative for polydipsia, polyuria, symptoms of hypoglycemia (dizziness, hunger, sweating) ?Hematologic:  Negative for anemia, purpura, petechia, prolonged or excessive bleeding, use of anticoagulants  ?Allergic:  Negative for difficulty breathing or choking as a result of exposure to anything; no shellfish allergy; no allergic response (rash/itch) to materials, foods ? ?Physical exam: ?BP (!) 151/72   Pulse (!) 59   Wt 160 lb (72.6 kg)   BMI 25.82 kg/m?  ?GENERAL APPEARANCE:  Well appearing, well developed, well nourished, NAD ?HEENT: Atraumatic, Normocephalic, oropharynx clear. ?NECK: Supple without lymphadenopathy or thyromegaly. ?LUNGS: Clear to auscultation bilaterally. ?HEART: Regular Rate and Rhythm without murmurs, gallops, or rubs. ?ABDOMEN: Soft, non-tender, No Masses. ?EXTREMITIES: Moves all extremities well.  Without clubbing, cyanosis, or edema. ?NEUROLOGIC:  Alert and oriented x 3, normal gait, CN II-XII grossly intact.  ?MENTAL STATUS:  Appropriate. ?BACK:  Non-tender to palpation.  No CVAT ?SKIN:  Warm, dry and intact.   ?GU: ?Penis:  uncircumcised ?Meatus: Normal ?Scrotum: normal, no masses ?Testis: normal without masses bilateral ?Epididymis: normal ?Prostate: 50 g, NT, no masses ?Rectum: Normal tone,  no masses or tenderness ? ? ?Results: ?U/A: 0-5 WBC, 0-2 RBC ? ?

## 2021-12-23 LAB — URINALYSIS, ROUTINE W REFLEX MICROSCOPIC
Bilirubin, UA: NEGATIVE
Glucose, UA: NEGATIVE
Leukocytes,UA: NEGATIVE
Nitrite, UA: NEGATIVE
Specific Gravity, UA: >1.03 — ABNORMAL HIGH (ref 1.005–1.030)
Urobilinogen, Ur: 0.2 mg/dL (ref 0.2–1.0)
pH, UA: 6 (ref 5.0–7.5)

## 2021-12-23 LAB — MICROSCOPIC EXAMINATION
Bacteria, UA: NONE SEEN
Epithelial Cells (non renal): NONE SEEN /hpf (ref 0–10)
Renal Epithel, UA: NONE SEEN /hpf

## 2021-12-23 LAB — PSA: Prostate Specific Ag, Serum: 0.3 ng/mL (ref 0.0–4.0)

## 2021-12-23 NOTE — Progress Notes (Signed)
Letter sent.

## 2022-09-05 DIAGNOSIS — I10 Essential (primary) hypertension: Secondary | ICD-10-CM | POA: Diagnosis not present

## 2022-09-05 DIAGNOSIS — E119 Type 2 diabetes mellitus without complications: Secondary | ICD-10-CM | POA: Diagnosis not present

## 2022-09-13 DIAGNOSIS — I10 Essential (primary) hypertension: Secondary | ICD-10-CM | POA: Diagnosis not present

## 2022-09-13 DIAGNOSIS — E785 Hyperlipidemia, unspecified: Secondary | ICD-10-CM | POA: Diagnosis not present

## 2022-12-20 ENCOUNTER — Ambulatory Visit: Payer: BC Managed Care – PPO | Admitting: Urology

## 2023-01-24 DIAGNOSIS — E119 Type 2 diabetes mellitus without complications: Secondary | ICD-10-CM | POA: Diagnosis not present

## 2023-01-24 DIAGNOSIS — I10 Essential (primary) hypertension: Secondary | ICD-10-CM | POA: Diagnosis not present

## 2023-02-06 ENCOUNTER — Ambulatory Visit: Payer: BC Managed Care – PPO | Admitting: Urology

## 2023-04-03 ENCOUNTER — Encounter: Payer: Self-pay | Admitting: Urology

## 2023-04-03 ENCOUNTER — Ambulatory Visit: Payer: BC Managed Care – PPO | Admitting: Urology

## 2023-04-03 VITALS — BP 133/73 | HR 50

## 2023-04-03 DIAGNOSIS — N400 Enlarged prostate without lower urinary tract symptoms: Secondary | ICD-10-CM

## 2023-04-03 DIAGNOSIS — R351 Nocturia: Secondary | ICD-10-CM

## 2023-04-03 DIAGNOSIS — Z125 Encounter for screening for malignant neoplasm of prostate: Secondary | ICD-10-CM

## 2023-04-03 NOTE — Progress Notes (Signed)
04/03/2023 1:56 PM   Evan Estes 10-18-1960 [id number]  Referring provider: Reather Converse, PA-C [address] [place],  [place] [address]  Prostate cancer screening and nocturia.   HPI: Evan Estes is a 62yo here for followup for prostate cancer screening and nocturia. IPSS 10 QOL 3 on no BPH therapy. Nocturia 0-3x depending on fluid consumption. Urine stream strong. PSA 0.3 over 1 year.    PMH: Past Medical History:  Diagnosis Date   Diabetes mellitus    Hypertension     Surgical History: Past Surgical History:  Procedure Laterality Date   COLONOSCOPY  09/27/2011   Procedure: COLONOSCOPY;  Surgeon: Malissa Hippo, MD;  [facility];  Service: Endoscopy;  Laterality: N/A;  10:45    Home Medications:  Allergies as of 04/03/2023   No Known Allergies      Medication List        Accurate as of April 03, 2023  1:56 PM. If you have any questions, ask your nurse or doctor.          STOP taking these medications    atenolol-chlorthalidone 50-25 MG tablet Commonly known as: TENORETIC       TAKE these medications    glipiZIDE 10 MG 24 hr tablet Commonly known as: GLUCOTROL XL Take 10 mg by mouth daily.   lovastatin 20 MG tablet Commonly known as: MEVACOR Take 20 mg by mouth every morning.   metFORMIN 500 MG tablet Commonly known as: GLUCOPHAGE Take 500 mg by mouth 2 (two) times daily with a meal.   olmesartan 20 MG tablet Commonly known as: BENICAR Take 20 mg by mouth daily.        Allergies: No Known Allergies  Family History: Family History  Problem Relation Age of Onset   Anesthesia problems Neg Hx     Social History:  reports that he has quit smoking. His smoking use included cigars. He has never used smokeless tobacco. He reports current alcohol use. He reports current drug use.  ROS: All other review of systems were reviewed and are negative except what is noted above in HPI  Physical Exam: BP 133/73   Pulse (!)  50   Constitutional:  Alert and oriented, No acute distress. HEENT: [place] AT, moist mucus membranes.  Trachea midline, no masses. Cardiovascular: No clubbing, cyanosis, or edema. Respiratory: Normal respiratory effort, no increased work of breathing. GI: Abdomen is soft, nontender, nondistended, no abdominal masses GU: No CVA tenderness. Circumcised phallus. No masses/lesions on penis, testis, scrotum. Prostate 40g smooth no nodules no induration.  Lymph: No cervical or inguinal lymphadenopathy. Skin: No rashes, bruises or suspicious lesions. Neurologic: Grossly intact, no focal deficits, moving all 4 extremities. Psychiatric: Normal mood and affect.  Laboratory Data: Lab Results  Component Value Date   WBC 6.2 02/22/2015   HGB 14.2 02/22/2015   HCT 42.2 02/22/2015   MCV 77.9 (L) 02/22/2015   PLT 229 02/22/2015    Lab Results  Component Value Date   CREATININE 0.89 02/22/2015    No results found for: "PSA"  No results found for: "TESTOSTERONE"  No results found for: "HGBA1C"  Urinalysis    Component Value Date/Time   COLORURINE YELLOW 02/22/2015 1522   APPEARANCEUR Clear 12/22/2021 1604   LABSPEC 1.027 02/22/2015 1522   PHURINE 7.0 02/22/2015 1522   GLUCOSEU Negative 12/22/2021 1604   HGBUR NEGATIVE 02/22/2015 1522   BILIRUBINUR Negative 12/22/2021 1604   KETONESUR NEGATIVE 02/22/2015 1522   PROTEINUR  1+ (A) 12/22/2021 1604   PROTEINUR NEGATIVE 02/22/2015 1522   UROBILINOGEN 0.2 02/22/2015 1522   NITRITE Negative 12/22/2021 1604   NITRITE NEGATIVE 02/22/2015 1522   LEUKOCYTESUR Negative 12/22/2021 1604    Lab Results  Component Value Date   LABMICR See below: 12/22/2021   WBCUA 0-5 12/22/2021   LABEPIT None seen 12/22/2021   MUCUS Present 12/22/2021   BACTERIA None seen 12/22/2021    Pertinent Imaging:  No results found for this or any previous visit.  No results found for this or any previous visit.  No results found for this or any previous  visit.  No results found for this or any previous visit.  No results found for this or any previous visit.  No valid procedures specified. No results found for this or any previous visit.  No results found for this or any previous visit.   Assessment & Plan:    1. BPH without obstruction/lower urinary tract symptoms Patient defers therapy - Urinalysis, Routine w reflex microscopic  2. Nocturia -decrease fluid intake within 2 hours of going to bed  3. Prostate cancer screening PSA today, if stable followup 1 year with PSA   No follow-ups on file.  Wilkie Aye, MD  [facility] [place]

## 2023-04-04 LAB — URINALYSIS, ROUTINE W REFLEX MICROSCOPIC
Bilirubin, UA: NEGATIVE
Glucose, UA: NEGATIVE
Ketones, UA: NEGATIVE
Leukocytes,UA: NEGATIVE
Nitrite, UA: NEGATIVE
Protein,UA: NEGATIVE
RBC, UA: NEGATIVE
Specific Gravity, UA: <1.005 — ABNORMAL LOW (ref 1.005–1.030)
Urobilinogen, Ur: 0.2 mg/dL (ref 0.2–1.0)
pH, UA: 7.5 (ref 5.0–7.5)

## 2023-04-04 LAB — PSA: Prostate Specific Ag, Serum: 0.3 ng/mL (ref 0.0–4.0)

## 2023-04-10 ENCOUNTER — Encounter: Payer: Self-pay | Admitting: Urology

## 2023-04-10 NOTE — Patient Instructions (Signed)

## 2023-05-28 DIAGNOSIS — K2970 Gastritis, unspecified, without bleeding: Secondary | ICD-10-CM | POA: Diagnosis not present

## 2023-05-28 DIAGNOSIS — K5900 Constipation, unspecified: Secondary | ICD-10-CM | POA: Diagnosis not present

## 2023-05-28 DIAGNOSIS — I10 Essential (primary) hypertension: Secondary | ICD-10-CM | POA: Diagnosis not present

## 2023-06-14 DIAGNOSIS — K2970 Gastritis, unspecified, without bleeding: Secondary | ICD-10-CM | POA: Diagnosis not present

## 2023-06-14 DIAGNOSIS — E119 Type 2 diabetes mellitus without complications: Secondary | ICD-10-CM | POA: Diagnosis not present

## 2023-06-14 DIAGNOSIS — K5900 Constipation, unspecified: Secondary | ICD-10-CM | POA: Diagnosis not present

## 2023-06-19 ENCOUNTER — Encounter (INDEPENDENT_AMBULATORY_CARE_PROVIDER_SITE_OTHER): Payer: Self-pay | Admitting: *Deleted

## 2023-06-28 ENCOUNTER — Encounter (INDEPENDENT_AMBULATORY_CARE_PROVIDER_SITE_OTHER): Payer: Self-pay | Admitting: Gastroenterology

## 2023-06-28 ENCOUNTER — Ambulatory Visit (INDEPENDENT_AMBULATORY_CARE_PROVIDER_SITE_OTHER): Payer: BC Managed Care – PPO | Admitting: Gastroenterology

## 2023-06-28 ENCOUNTER — Telehealth (INDEPENDENT_AMBULATORY_CARE_PROVIDER_SITE_OTHER): Payer: Self-pay | Admitting: Gastroenterology

## 2023-06-28 VITALS — BP 136/83 | HR 78 | Temp 98.5°F | Ht 66.0 in | Wt 165.0 lb

## 2023-06-28 DIAGNOSIS — K581 Irritable bowel syndrome with constipation: Secondary | ICD-10-CM | POA: Diagnosis not present

## 2023-06-28 DIAGNOSIS — R194 Change in bowel habit: Secondary | ICD-10-CM | POA: Diagnosis not present

## 2023-06-28 DIAGNOSIS — R1084 Generalized abdominal pain: Secondary | ICD-10-CM | POA: Diagnosis not present

## 2023-06-28 DIAGNOSIS — K589 Irritable bowel syndrome without diarrhea: Secondary | ICD-10-CM | POA: Insufficient documentation

## 2023-06-28 DIAGNOSIS — R109 Unspecified abdominal pain: Secondary | ICD-10-CM | POA: Insufficient documentation

## 2023-06-28 MED ORDER — LINACLOTIDE 290 MCG PO CAPS
290.0000 ug | ORAL_CAPSULE | Freq: Every day | ORAL | 3 refills | Status: DC
Start: 2023-06-28 — End: 2024-06-02

## 2023-06-28 NOTE — Progress Notes (Signed)
Katrinka Blazing, M.D. Gastroenterology & Hepatology [facility] [address] [place], [place] [address] Primary Care Physician: Reather Converse, PA-C [address] [place] [address]  Referring MD: PCP  Chief Complaint: Constipation, abdominal pain and bloating.  History of Present Illness: Evan Estes is a 62 y.o. male with Pmh DM and HTN, who presents for evaluation of constipation, abdominal pain and bloating.  Patient reports that starting later July 2024 he presented new onset of constipation and straining to have bowel movements. He states that due to the constipation he had worsening upper abdominal pain. States he used OTC laxatives like Miralax, but he only had a small amount of stool every couple of days. However, he reports that despite having bowel movements occasionally, he did not have relief of the abdominal pain. Also had some bloating and could not eat too much food.  He saw his PCP who started him on Linzess 145 mcg BID and famotidine 40 mg BID,  which was started on 05/28/2023. States since he started these medications, the abdominal pain has improved substantially and only has soreness in his mid abdomen. He reports that he is moving his bowels twice a day.  States he stopped using his BP medications as he felt it was slowing down the effect of her Linzess. He does not remember the name of the medication for BP, only medication listed is Benicar.  The patient denies having any nausea, vomiting, fever, chills, hematochezia, melena, hematemesis, jaundice, pruritus or weight loss.  Most recent labs - 04/03/23 normal UA. No recent abdominal imaging.  Last QMV:HQION Last Colonoscopy:09/27/2011 Prep excellent. Colonic mucosa normal throughout. 3 mm rectal polyp ablated via cold biopsy - hyperplastic Small hemorrhoids below the dentate line.  Patient possibly had a colonoscopy in 06/24/2021 with Dr. Reesa Chew  -  at [facility]  FHx: neg for any gastrointestinal/liver disease, uncle cancer unknown primary Social: neg smoking, alcohol or illicit drug use Surgical: no abdominal surgeries  Past Medical History: Past Medical History:  Diagnosis Date   Diabetes mellitus    Hypertension     Past Surgical History: Past Surgical History:  Procedure Laterality Date   COLONOSCOPY  09/27/2011   Procedure: COLONOSCOPY;  Surgeon: Malissa Hippo, MD;  [facility];  Service: Endoscopy;  Laterality: N/A;  10:45    Family History: Family History  Problem Relation Age of Onset   Anesthesia problems Neg Hx     Social History: Social History   Tobacco Use  Smoking Status Former   Types: Cigars  Smokeless Tobacco Never  Tobacco Comments   quit about 2 years ago   Social History   Substance and Sexual Activity  Alcohol Use Yes   Comment: occasional   Social History   Substance and Sexual Activity  Drug Use Yes    Allergies: No Known Allergies  Medications: Current Outpatient Medications  Medication Sig Dispense Refill   famotidine (PEPCID) 40 MG tablet Take 40 mg by mouth daily.     glipiZIDE (GLUCOTROL XL) 10 MG 24 hr tablet Take 10 mg by mouth daily.     linaclotide (LINZESS) 290 MCG CAPS capsule Take 1 capsule (290 mcg total) by mouth daily before breakfast. 90 capsule 3   lovastatin (MEVACOR) 20 MG tablet Take 20 mg by mouth every morning.     metFORMIN (GLUCOPHAGE) 500 MG tablet Take 500 mg by mouth 2 (two) times daily with a meal.  olmesartan (BENICAR) 20 MG tablet Take 20 mg by mouth daily.     No current facility-administered medications for this visit.    Review of Systems: GENERAL: negative for malaise, night sweats HEENT: No changes in hearing or vision, no nose bleeds or other nasal problems. NECK: Negative for lumps, goiter, pain and significant neck swelling RESPIRATORY: Negative for cough, wheezing CARDIOVASCULAR: Negative for chest pain, leg swelling,  palpitations, orthopnea GI: SEE HPI MUSCULOSKELETAL: Negative for joint pain or swelling, back pain, and muscle pain. SKIN: Negative for lesions, rash PSYCH: Negative for sleep disturbance, mood disorder and recent psychosocial stressors. HEMATOLOGY Negative for prolonged bleeding, bruising easily, and swollen nodes. ENDOCRINE: Negative for cold or heat intolerance, polyuria, polydipsia and goiter. NEURO: negative for tremor, gait imbalance, syncope and seizures. The remainder of the review of systems is noncontributory.   Physical Exam: BP 136/83   Pulse 78   Temp 98.5 F (36.9 C) (Oral)   Ht 5\' 6"  (1.676 m)   Wt 165 lb (74.8 kg)   BMI 26.63 kg/m  GENERAL: The patient is AO x3, in no acute distress. HEENT: Head is normocephalic and atraumatic. EOMI are intact. Mouth is well hydrated and without lesions. NECK: Supple. No masses LUNGS: Clear to auscultation. No presence of rhonchi/wheezing/rales. Adequate chest expansion HEART: RRR, normal s1 and s2. ABDOMEN: mildly tender in the R paraumbilical area, no guarding, no peritoneal signs, and nondistended. BS +. No masses. EXTREMITIES: Without any cyanosis, clubbing, rash, lesions or edema. NEUROLOGIC: AOx3, no focal motor deficit. SKIN: no jaundice, no rashes   Imaging/Labs: as above  I personally reviewed and interpreted the available labs, imaging and endoscopic files.  Impression and Plan: DONOVANN ORRICK is a 62 y.o. male with Pmh DM and HTN, who presents for evaluation of constipation, abdominal pain and bloating.  The patient presented new onset of constipation and severe abdominal pain without other symptoms or red flag signs.  He significantly improved after starting an anti-H2 medication and Linzess.  It is possible he has presented symptoms related to worsening constipation, for which I advised him to take Linzess 290 mcg every day, but we will also evaluate metabolic etiologies of his current patient with a CBC, CMP and TSH.   Also, given the severity of his pain in the past, we will form a CT of the abdomen and pelvis with IV contrast.  Finally, I advised him to decrease the dose of famotidine to once a day dosing as I believe most of his abdominal complaints were related to constipation.  -Schedule CT abdomen/pelvis with IV contrast -CBC, CMP, TSH -Will request colonoscopy records from [facility] -Decrease famotidine to 40 mg qday -Will send new prescription for Linzess 290 mcg qday  All questions were answered.      Katrinka Blazing, MD Gastroenterology and Hepatology [facility]

## 2023-06-28 NOTE — Telephone Encounter (Signed)
Left message to return call 

## 2023-06-28 NOTE — Telephone Encounter (Signed)
CT has been approved. Valid from 06/28/23-07/27/23. Auth number [id number]. Central Scheduling contacted and scheduled pt for 07/25/23 at 12:30pm. Pt to arrive at 10:30am to begin drinking oral contrast and exam will begin at 12:30pm. Attempted to reach pt but no voicemail available. Will mail appt reminder.

## 2023-06-28 NOTE — Patient Instructions (Signed)
Schedule CT abdomen/pelvis with IV contrast Perform blood workup Will request colonoscopy records from [facility] Decrease famotidine to 40 mg qday Will send new prescription for Linzess 290 mcg qday

## 2023-07-10 ENCOUNTER — Telehealth (INDEPENDENT_AMBULATORY_CARE_PROVIDER_SITE_OTHER): Payer: Self-pay | Admitting: Gastroenterology

## 2023-07-10 NOTE — Telephone Encounter (Signed)
Patient returned your call.

## 2023-07-16 NOTE — Telephone Encounter (Signed)
Appt letter sent to patient. Tried reaching out to pt but had to leave message

## 2023-07-25 ENCOUNTER — Encounter (HOSPITAL_COMMUNITY): Payer: Self-pay | Admitting: Radiology

## 2023-07-25 ENCOUNTER — Ambulatory Visit (HOSPITAL_COMMUNITY)
Admission: RE | Admit: 2023-07-25 | Discharge: 2023-07-25 | Disposition: A | Discharge status: Home / Self Care | Payer: BC Managed Care – PPO | Source: Ambulatory Visit | Attending: Gastroenterology | Admitting: Gastroenterology

## 2023-07-25 DIAGNOSIS — K5900 Constipation, unspecified: Secondary | ICD-10-CM | POA: Diagnosis not present

## 2023-07-25 DIAGNOSIS — R1084 Generalized abdominal pain: Secondary | ICD-10-CM | POA: Insufficient documentation

## 2023-07-25 DIAGNOSIS — R109 Unspecified abdominal pain: Secondary | ICD-10-CM | POA: Diagnosis not present

## 2023-07-25 DIAGNOSIS — R140 Abdominal distension (gaseous): Secondary | ICD-10-CM | POA: Diagnosis not present

## 2023-07-25 DIAGNOSIS — K581 Irritable bowel syndrome with constipation: Secondary | ICD-10-CM | POA: Diagnosis not present

## 2023-07-25 LAB — POCT I-STAT CREATININE: Creatinine, Ser: 1.1 mg/dL (ref 0.61–1.24)

## 2023-07-25 MED ORDER — IOHEXOL 300 MG/ML  SOLN
100.0000 mL | Freq: Once | INTRAMUSCULAR | Status: AC | PRN
  Administered 2023-07-25: 100 mL via INTRAVENOUS

## 2023-08-03 ENCOUNTER — Encounter (INDEPENDENT_AMBULATORY_CARE_PROVIDER_SITE_OTHER): Payer: Self-pay | Admitting: *Deleted

## 2023-10-01 ENCOUNTER — Ambulatory Visit (INDEPENDENT_AMBULATORY_CARE_PROVIDER_SITE_OTHER): Payer: BC Managed Care – PPO | Admitting: Gastroenterology

## 2023-10-16 DIAGNOSIS — E785 Hyperlipidemia, unspecified: Secondary | ICD-10-CM | POA: Diagnosis not present

## 2023-10-16 DIAGNOSIS — E119 Type 2 diabetes mellitus without complications: Secondary | ICD-10-CM | POA: Diagnosis not present

## 2023-10-16 DIAGNOSIS — I10 Essential (primary) hypertension: Secondary | ICD-10-CM | POA: Diagnosis not present

## 2023-10-16 DIAGNOSIS — Z7984 Long term (current) use of oral hypoglycemic drugs: Secondary | ICD-10-CM | POA: Diagnosis not present

## 2023-10-16 DIAGNOSIS — E7800 Pure hypercholesterolemia, unspecified: Secondary | ICD-10-CM | POA: Diagnosis not present

## 2024-03-17 ENCOUNTER — Other Ambulatory Visit: Payer: BC Managed Care – PPO

## 2024-03-24 ENCOUNTER — Ambulatory Visit: Payer: BC Managed Care – PPO | Admitting: Urology

## 2024-06-02 ENCOUNTER — Encounter: Payer: Self-pay | Admitting: Family Medicine

## 2024-06-02 ENCOUNTER — Ambulatory Visit: Admitting: Family Medicine

## 2024-06-02 VITALS — BP 175/81 | HR 71 | Ht 68.0 in | Wt 165.0 lb

## 2024-06-02 DIAGNOSIS — E559 Vitamin D deficiency, unspecified: Secondary | ICD-10-CM | POA: Diagnosis not present

## 2024-06-02 DIAGNOSIS — E1169 Type 2 diabetes mellitus with other specified complication: Secondary | ICD-10-CM | POA: Insufficient documentation

## 2024-06-02 DIAGNOSIS — E119 Type 2 diabetes mellitus without complications: Secondary | ICD-10-CM | POA: Insufficient documentation

## 2024-06-02 DIAGNOSIS — E1165 Type 2 diabetes mellitus with hyperglycemia: Secondary | ICD-10-CM

## 2024-06-02 DIAGNOSIS — E785 Hyperlipidemia, unspecified: Secondary | ICD-10-CM

## 2024-06-02 DIAGNOSIS — I10 Essential (primary) hypertension: Secondary | ICD-10-CM | POA: Diagnosis not present

## 2024-06-02 DIAGNOSIS — Z114 Encounter for screening for human immunodeficiency virus [HIV]: Secondary | ICD-10-CM | POA: Diagnosis not present

## 2024-06-02 DIAGNOSIS — Z1159 Encounter for screening for other viral diseases: Secondary | ICD-10-CM | POA: Diagnosis not present

## 2024-06-02 DIAGNOSIS — K581 Irritable bowel syndrome with constipation: Secondary | ICD-10-CM

## 2024-06-02 DIAGNOSIS — E038 Other specified hypothyroidism: Secondary | ICD-10-CM | POA: Diagnosis not present

## 2024-06-02 MED ORDER — LOVASTATIN 20 MG PO TABS: 20.0000 mg | ORAL_TABLET | Freq: Every day | ORAL | 1 refills | Status: DC

## 2024-06-02 MED ORDER — METFORMIN HCL 500 MG PO TABS: 1000.0000 mg | ORAL_TABLET | Freq: Two times a day (BID) | ORAL | 1 refills | Status: DC

## 2024-06-02 MED ORDER — OLMESARTAN MEDOXOMIL 40 MG PO TABS: 40.0000 mg | ORAL_TABLET | Freq: Every day | ORAL | 1 refills | Status: DC

## 2024-06-02 MED ORDER — LINACLOTIDE 290 MCG PO CAPS: 290.0000 ug | ORAL_CAPSULE | Freq: Every day | ORAL | 3 refills | Status: DC

## 2024-06-02 NOTE — Progress Notes (Signed)
 New Patient Office Visit  Subjective:  Patient ID: Evan Estes, male    DOB: 22-Mar-1961  Age: 63 y.o. MRN: [id number]  CC:  Chief Complaint  Patient presents with   Establish Care    HPI Evan Estes is a 63 y.o. male with past medical history of hypertension, type II diabetes, hyperlipidemia presents for establishing care  For the details of today's visit, please refer to the assessment and plan.    Past Medical History:  Diagnosis Date   Diabetes mellitus    Hypertension     Past Surgical History:  Procedure Laterality Date   COLONOSCOPY  09/27/2011   Procedure: COLONOSCOPY;  Surgeon: Claudis RAYMOND Rivet, MD;  Location: [facility];  Service: Endoscopy;  Laterality: N/A;  10:45    Family History  Problem Relation Age of Onset   Anesthesia problems Neg Hx     Social History   Socioeconomic History   Marital status: Married    Spouse name: Not on file   Number of children: Not on file   Years of education: Not on file   Highest education level: 9th grade  Occupational History   Not on file  Tobacco Use   Smoking status: Former    Types: Cigars   Smokeless tobacco: Never   Tobacco comments:    quit about 2 years ago  Substance and Sexual Activity   Alcohol use: Yes    Comment: occasional   Drug use: Yes   Sexual activity: Yes  Other Topics Concern   Not on file  Social History Narrative   Not on file   Social Drivers of Health   Financial Resource Strain: Low Risk  (06/01/2024)   Overall Financial Resource Strain (CARDIA)    Difficulty of Paying Living Expenses: Not very hard  Food Insecurity: Food Insecurity Present (06/01/2024)   Hunger Vital Sign    Worried About Running Out of Food in the Last Year: Sometimes true    Ran Out of Food in the Last Year: Never true  Transportation Needs: No Transportation Needs (06/01/2024)   PRAPARE - Administrator, Civil Service (Medical): No    Lack of Transportation (Non-Medical): No  Physical  Activity: Insufficiently Active (06/01/2024)   Exercise Vital Sign    Days of Exercise per Week: 3 days    Minutes of Exercise per Session: 40 min  Stress: Stress Concern Present (06/01/2024)   [employer] of Occupational Health - Occupational Stress Questionnaire    Feeling of Stress: To some extent  Social Connections: Socially Isolated (06/01/2024)   Social Connection and Isolation Panel    Frequency of Communication with Friends and Family: Never    Frequency of Social Gatherings with Friends and Family: Once a week    Attends Religious Services: Never    Database administrator or Organizations: No    Attends Engineer, structural: Not on file    Marital Status: Widowed  Intimate Partner Violence: Unknown (01/19/2022)   Received from [facility]   HITS    Physically Hurt: Not on file    Insult or Talk Down To: Not on file    Threaten Physical Harm: Not on file    Scream or Curse: Not on file    ROS Review of Systems  Constitutional:  Negative for fatigue and fever.  Eyes:  Negative for visual disturbance.  Respiratory:  Negative for chest tightness and shortness of breath.   Cardiovascular:  Negative for  chest pain and palpitations.  Neurological:  Negative for dizziness and headaches.    Objective:   Today's Vitals: BP (!) 175/81   Pulse 71   Ht 5' 8 (1.727 m)   Wt 165 lb (74.8 kg)   SpO2 96%   BMI 25.09 kg/m   Physical Exam HENT:     Head: Normocephalic.     Right Ear: External ear normal.     Left Ear: External ear normal.     Nose: No congestion or rhinorrhea.     Mouth/Throat:     Mouth: Mucous membranes are moist.  Cardiovascular:     Rate and Rhythm: Regular rhythm.     Heart sounds: No murmur heard. Pulmonary:     Effort: No respiratory distress.     Breath sounds: Normal breath sounds.  Neurological:     Mental Status: He is alert.      Assessment & Plan:   Primary hypertension Assessment & Plan: Hypertension -  uncontrolled The patient presents with uncontrolled blood pressure in the clinic today. He was previously taking atenolol-chlorthalidone 50/25 mg daily but reports being out of his medication for the past few days. He also states that while on this regimen, his blood pressure often remained elevated (>140/90 mmHg).  Today, he is asymptomatic (denies chest pain, shortness of breath, headache, dizziness, or vision changes). Discontinue atenolol-chlorthalidone Initiate losartan 40 mg daily Encouraged medication adherence and home blood pressure monitoring with documentation of readings  A low-sodium diet of less than 2,300 mg daily is recommended, along with moderate-intensity physical activity for at least 150 minutes per week. The patient is encouraged to maintain these lifestyle modifications to help manage her blood pressure effectively.  Long-term considerations were discussed, emphasizing that uncontrolled hypertension increases the risk of cardiovascular diseases, including stroke, coronary artery disease, and heart failure.  The patient is encouraged to seek emergency care if blood pressure exceeds 180/120 and is accompanied by symptoms such as headaches, chest pain, palpitations, blurred vision, or dizziness. She verbalized understanding and will follow up as scheduled.   Orders: -     Olmesartan  Medoxomil; Take 1 tablet (40 mg total) by mouth daily.  Dispense: 30 tablet; Refill: 1  Hyperlipidemia LDL goal <55 Assessment & Plan: Refill lovastatin  20 mg daily  Lifestyle modifications were also discussed, including avoiding simple carbohydrates such as cakes, sweet desserts, ice cream, soda (diet or regular), sweet tea, candies, chips, cookies, store-bought juices, excessive alcohol (more than 1-2 drinks per day), lemonade, artificial sweeteners, donuts, coffee creamers, and sugar-free products. Additionally, the patient was advised to reduce the consumption of greasy, fatty foods and  increase physical activity to support cardiovascular health. The patient verbalized understanding and is aware of the plan of care.   Orders: -     Lovastatin ; Take 1 tablet (20 mg total) by mouth daily.  Dispense: 90 tablet; Refill: 1 -     Lipid panel -     CMP14+EGFR -     CBC with Differential/Platelet  Type 2 diabetes mellitus with hyperglycemia, without long-term current use of insulin (HCC) Assessment & Plan: Refill metformin  1000 mg PO BID. Patient reports not taking glipizide 10 mg daily. Denies polyuria, polyphagia, and polydipsia. Continue current regimen as prescribed. Encouraged dietary modifications, specifically reducing intake of high-sugar foods and beverages. Increase physical activity to 150 minutes of moderate-intensity exercise per week, as tolerated. Monitor blood glucose closely and follow up with any episodes of hypoglycemia or persistently elevated readings. Routine follow-up as scheduled.  Orders: -     metFORMIN  HCl; Take 2 tablets (1,000 mg total) by mouth 2 (two) times daily with a meal.  Dispense: 90 tablet; Refill: 1 -     Hemoglobin A1c  Irritable bowel syndrome with constipation -     linaCLOtide ; Take 1 capsule (290 mcg total) by mouth daily before breakfast.  Dispense: 90 capsule; Refill: 3  Vitamin D  deficiency -     VITAMIN D  25 Hydroxy (Vit-D Deficiency, Fractures)  Need for hepatitis C screening test -     Hepatitis C antibody  Encounter for screening for HIV -     HIV Antibody (routine testing w rflx)  TSH (thyroid-stimulating hormone deficiency) -     TSH + free T4  Note: This chart has been completed using Engineer, civil (consulting) software, and while attempts have been made to ensure accuracy, certain words and phrases may not be transcribed as intended.     Follow-up: Return in about 3 months (around 09/02/2024).   Raiden Haydu, FNP

## 2024-06-02 NOTE — Assessment & Plan Note (Addendum)
 Refill lovastatin  20 mg daily  Lifestyle modifications were also discussed, including avoiding simple carbohydrates such as cakes, sweet desserts, ice cream, soda (diet or regular), sweet tea, candies, chips, cookies, store-bought juices, excessive alcohol (more than 1-2 drinks per day), lemonade, artificial sweeteners, donuts, coffee creamers, and sugar-free products. Additionally, the patient was advised to reduce the consumption of greasy, fatty foods and increase physical activity to support cardiovascular health. The patient verbalized understanding and is aware of the plan of care.

## 2024-06-02 NOTE — Patient Instructions (Addendum)
 I appreciate the opportunity to provide care to you today!    Follow up:  nurse visit in 1 month for BP/ 3 months  Labs: please stop by the lab today to get your blood drawn (CBC, CMP, TSH, Lipid profile, HgA1c, Vit D)   Hypertension Management  Your current blood pressure is above the target goal of <140/90 mmHg. To address this, please start taking Olmesartan  40 mg daily.   Medication Instructions: Take your blood pressure medication at the same time each day. After taking your medication, check your blood pressure at least an hour later. If your first reading is >140/90 mmHg, wait at least 10 minutes and recheck your blood pressure. Side Effects: In the initial days of therapy, you may experience dizziness or lightheadedness as your body adjusts to the lower blood pressure; this is expected. Diet and Lifestyle: Adhere to a low-sodium diet, limiting intake to less than 1500 mg daily, and increase your physical activity. Avoid over-the-counter NSAIDs such as ibuprofen and naproxen  while on this medication. Hydration and Nutrition: Stay well-hydrated by drinking at least 64 ounces of water  daily. Increase your servings of fruits and vegetables and avoid excessive sodium in your diet. Long-Term Considerations: Uncontrolled hypertension can increase the risk of cardiovascular diseases, including stroke, coronary artery disease, and heart failure.  Please report to the emergency department if your blood pressure exceeds 180/120 and is accompanied by symptoms such as headaches, chest pain, palpitations, blurred vision, or dizziness.    Please follow up if your symptoms worsen or fail to improve.    Attached with your AVS, you will find valuable resources for self-education. I highly recommend dedicating some time to thoroughly examine them.   Please continue to a heart-healthy diet and increase your physical activities. Try to exercise for at least five days a week.    It was a  pleasure to see you and I look forward to continuing to work together on your health and well-being. Please do not hesitate to call the office if you need care or have questions about your care.  In case of emergency, please visit the Emergency Department for urgent care, or contact our clinic at [phone] to schedule an appointment. We're here to help you!   Have a wonderful day and week. With Gratitude, Clarece Drzewiecki MSN, FNP-BC

## 2024-06-02 NOTE — Assessment & Plan Note (Signed)
 Refill metformin  1000 mg PO BID. Patient reports not taking glipizide 10 mg daily. Denies polyuria, polyphagia, and polydipsia. Continue current regimen as prescribed. Encouraged dietary modifications, specifically reducing intake of high-sugar foods and beverages. Increase physical activity to 150 minutes of moderate-intensity exercise per week, as tolerated. Monitor blood glucose closely and follow up with any episodes of hypoglycemia or persistently elevated readings. Routine follow-up as scheduled.

## 2024-06-02 NOTE — Assessment & Plan Note (Signed)
 Hypertension - uncontrolled The patient presents with uncontrolled blood pressure in the clinic today. He was previously taking atenolol-chlorthalidone 50/25 mg daily but reports being out of his medication for the past few days. He also states that while on this regimen, his blood pressure often remained elevated (>140/90 mmHg).  Today, he is asymptomatic (denies chest pain, shortness of breath, headache, dizziness, or vision changes). Discontinue atenolol-chlorthalidone Initiate losartan 40 mg daily Encouraged medication adherence and home blood pressure monitoring with documentation of readings  A low-sodium diet of less than 2,300 mg daily is recommended, along with moderate-intensity physical activity for at least 150 minutes per week. The patient is encouraged to maintain these lifestyle modifications to help manage her blood pressure effectively.  Long-term considerations were discussed, emphasizing that uncontrolled hypertension increases the risk of cardiovascular diseases, including stroke, coronary artery disease, and heart failure.  The patient is encouraged to seek emergency care if blood pressure exceeds 180/120 and is accompanied by symptoms such as headaches, chest pain, palpitations, blurred vision, or dizziness. She verbalized understanding and will follow up as scheduled.

## 2024-06-03 ENCOUNTER — Ambulatory Visit: Payer: Self-pay | Admitting: Family Medicine

## 2024-06-03 DIAGNOSIS — E559 Vitamin D deficiency, unspecified: Secondary | ICD-10-CM

## 2024-06-03 DIAGNOSIS — E1165 Type 2 diabetes mellitus with hyperglycemia: Secondary | ICD-10-CM

## 2024-06-03 MED ORDER — OZEMPIC (0.25 OR 0.5 MG/DOSE) 2 MG/3ML ~~LOC~~ SOPN: 0.2500 mg | PEN_INJECTOR | SUBCUTANEOUS | 0 refills | Status: DC

## 2024-06-03 MED ORDER — VITAMIN D (ERGOCALCIFEROL) 1.25 MG (50000 UNIT) PO CAPS: 50000.0000 [IU] | ORAL_CAPSULE | ORAL | 1 refills | Status: DC

## 2024-06-04 ENCOUNTER — Other Ambulatory Visit (HOSPITAL_COMMUNITY): Payer: Self-pay

## 2024-06-04 ENCOUNTER — Telehealth: Payer: Self-pay | Admitting: Pharmacy Technician

## 2024-06-04 LAB — HIV ANTIBODY (ROUTINE TESTING W REFLEX): HIV Screen 4th Generation wRfx: NONREACTIVE

## 2024-06-04 LAB — CBC WITH DIFFERENTIAL/PLATELET
Basophils Absolute: 0 x10E3/uL (ref 0.0–0.2)
Basos: 0 %
EOS (ABSOLUTE): 0.2 x10E3/uL (ref 0.0–0.4)
Eos: 2 %
Hematocrit: 41 % (ref 37.5–51.0)
Hemoglobin: 13.2 g/dL (ref 13.0–17.7)
Immature Grans (Abs): 0 x10E3/uL (ref 0.0–0.1)
Immature Granulocytes: 0 %
Lymphocytes Absolute: 2.3 x10E3/uL (ref 0.7–3.1)
Lymphs: 34 %
MCH: 26.4 pg — ABNORMAL LOW (ref 26.6–33.0)
MCHC: 32.2 g/dL (ref 31.5–35.7)
MCV: 82 fL (ref 79–97)
Monocytes Absolute: 0.5 x10E3/uL (ref 0.1–0.9)
Monocytes: 7 %
Neutrophils Absolute: 3.9 x10E3/uL (ref 1.4–7.0)
Neutrophils: 57 %
Platelets: 268 x10E3/uL (ref 150–450)
RBC: 5 x10E6/uL (ref 4.14–5.80)
RDW: 14.7 % (ref 11.6–15.4)
WBC: 6.9 x10E3/uL (ref 3.4–10.8)

## 2024-06-04 LAB — HEMOGLOBIN A1C
Est. average glucose Bld gHb Est-mCnc: 229 mg/dL
Hgb A1c MFr Bld: 9.6 % — ABNORMAL HIGH (ref 4.8–5.6)

## 2024-06-04 LAB — TSH+FREE T4
Free T4: 1.09 ng/dL (ref 0.82–1.77)
TSH: 1.55 u[IU]/mL (ref 0.450–4.500)

## 2024-06-04 LAB — CMP14+EGFR
ALT: 17 IU/L (ref 0–44)
AST: 13 IU/L (ref 0–40)
Albumin: 4.2 g/dL (ref 3.9–4.9)
Alkaline Phosphatase: 85 IU/L (ref 44–121)
BUN/Creatinine Ratio: 6 — ABNORMAL LOW (ref 10–24)
BUN: 5 mg/dL — ABNORMAL LOW (ref 8–27)
Bilirubin Total: 0.4 mg/dL (ref 0.0–1.2)
CO2: 25 mmol/L (ref 20–29)
Calcium: 9.6 mg/dL (ref 8.6–10.2)
Chloride: 99 mmol/L (ref 96–106)
Creatinine, Ser: 0.85 mg/dL (ref 0.76–1.27)
Globulin, Total: 1.8 g/dL (ref 1.5–4.5)
Glucose: 194 mg/dL — ABNORMAL HIGH (ref 70–99)
Potassium: 4.4 mmol/L (ref 3.5–5.2)
Sodium: 139 mmol/L (ref 134–144)
Total Protein: 6 g/dL (ref 6.0–8.5)
eGFR: 98 mL/min/1.73 (ref >59)

## 2024-06-04 LAB — LIPID PANEL
Chol/HDL Ratio: 2.8 ratio (ref 0.0–5.0)
Cholesterol, Total: 141 mg/dL (ref 100–199)
HDL: 51 mg/dL (ref >39)
LDL Chol Calc (NIH): 76 mg/dL (ref 0–99)
Triglycerides: 71 mg/dL (ref 0–149)
VLDL Cholesterol Cal: 14 mg/dL (ref 5–40)

## 2024-06-04 LAB — HEPATITIS C ANTIBODY: Hep C Virus Ab: NONREACTIVE

## 2024-06-04 LAB — VITAMIN D 25 HYDROXY (VIT D DEFICIENCY, FRACTURES): Vit D, 25-Hydroxy: 16 ng/mL — ABNORMAL LOW (ref 30.0–100.0)

## 2024-06-04 NOTE — Telephone Encounter (Signed)
 Pharmacy Patient Advocate Encounter  Received notification from [employer] that Prior Authorization for Ozempic  (0.25 or 0.5 MG/DOSE) 2MG /3ML pen-injectors  has been APPROVED from 06/04/2024 to 06/04/2025. Ran test claim, Copay is $24.99. This test claim was processed through [facility]- copay amounts may vary at other pharmacies due to pharmacy/plan contracts, or as the patient moves through the different stages of their insurance plan.   PA #/Case ID/Reference #: [id number]

## 2024-06-04 NOTE — Telephone Encounter (Signed)
 Pharmacy Patient Advocate Encounter   Received notification from CoverMyMeds that prior authorization for Ozempic  (0.25 or 0.5 MG/DOSE) 2MG /3ML pen-injectors  is required/requested.   Insurance verification completed.   The patient is insured through [facility] .   Per test claim: PA required; PA submitted to above mentioned insurance via Latent Key/confirmation #/EOC [facility] Status is pending

## 2024-07-04 ENCOUNTER — Ambulatory Visit (INDEPENDENT_AMBULATORY_CARE_PROVIDER_SITE_OTHER)

## 2024-07-04 ENCOUNTER — Other Ambulatory Visit: Payer: Self-pay | Admitting: Family Medicine

## 2024-07-04 VITALS — BP 158/82

## 2024-07-04 DIAGNOSIS — I10 Essential (primary) hypertension: Secondary | ICD-10-CM | POA: Diagnosis not present

## 2024-07-04 MED ORDER — AMLODIPINE BESYLATE 10 MG PO TABS
10.0000 mg | ORAL_TABLET | Freq: Every day | ORAL | 1 refills | Status: DC
Start: 2024-07-04 — End: 2024-07-15

## 2024-07-04 NOTE — Progress Notes (Addendum)
 Patient is in office today for a nurse visit for Blood Pressure Check. Patient blood pressure was 162/80 then 158/82, Patient No chest pain, No shortness of breath, No dyspnea on exertion

## 2024-07-15 ENCOUNTER — Other Ambulatory Visit: Payer: Self-pay | Admitting: Family Medicine

## 2024-07-15 DIAGNOSIS — I10 Essential (primary) hypertension: Secondary | ICD-10-CM

## 2024-07-15 MED ORDER — OLMESARTAN-AMLODIPINE-HCTZ 40-10-12.5 MG PO TABS: 1.0000 | ORAL_TABLET | Freq: Every day | ORAL | 1 refills | Status: DC

## 2024-08-20 ENCOUNTER — Encounter (INDEPENDENT_AMBULATORY_CARE_PROVIDER_SITE_OTHER): Payer: Self-pay | Admitting: Gastroenterology

## 2024-09-18 ENCOUNTER — Ambulatory Visit: Admitting: Family Medicine

## 2024-09-28 ENCOUNTER — Emergency Department (HOSPITAL_COMMUNITY)

## 2024-09-28 ENCOUNTER — Encounter (HOSPITAL_COMMUNITY): Payer: Self-pay

## 2024-09-28 ENCOUNTER — Observation Stay (HOSPITAL_COMMUNITY)

## 2024-09-28 ENCOUNTER — Observation Stay (HOSPITAL_COMMUNITY)
Admission: EM | Admit: 2024-09-28 | Discharge: 2024-09-29 | Disposition: A | Attending: Internal Medicine | Admitting: Internal Medicine

## 2024-09-28 ENCOUNTER — Other Ambulatory Visit: Payer: Self-pay

## 2024-09-28 DIAGNOSIS — E785 Hyperlipidemia, unspecified: Secondary | ICD-10-CM | POA: Diagnosis not present

## 2024-09-28 DIAGNOSIS — R519 Headache, unspecified: Secondary | ICD-10-CM | POA: Diagnosis present

## 2024-09-28 DIAGNOSIS — I639 Cerebral infarction, unspecified: Secondary | ICD-10-CM | POA: Diagnosis present

## 2024-09-28 DIAGNOSIS — I10 Essential (primary) hypertension: Principal | ICD-10-CM | POA: Insufficient documentation

## 2024-09-28 DIAGNOSIS — Z87891 Personal history of nicotine dependence: Secondary | ICD-10-CM | POA: Diagnosis not present

## 2024-09-28 DIAGNOSIS — E782 Mixed hyperlipidemia: Secondary | ICD-10-CM | POA: Diagnosis not present

## 2024-09-28 DIAGNOSIS — E1165 Type 2 diabetes mellitus with hyperglycemia: Secondary | ICD-10-CM | POA: Insufficient documentation

## 2024-09-28 DIAGNOSIS — F1990 Other psychoactive substance use, unspecified, uncomplicated: Secondary | ICD-10-CM | POA: Diagnosis not present

## 2024-09-28 DIAGNOSIS — Z8673 Personal history of transient ischemic attack (TIA), and cerebral infarction without residual deficits: Secondary | ICD-10-CM | POA: Diagnosis present

## 2024-09-28 DIAGNOSIS — I720 Aneurysm of carotid artery: Secondary | ICD-10-CM

## 2024-09-28 DIAGNOSIS — I160 Hypertensive urgency: Secondary | ICD-10-CM | POA: Diagnosis not present

## 2024-09-28 DIAGNOSIS — E1169 Type 2 diabetes mellitus with other specified complication: Secondary | ICD-10-CM | POA: Diagnosis present

## 2024-09-28 DIAGNOSIS — F1090 Alcohol use, unspecified, uncomplicated: Secondary | ICD-10-CM | POA: Diagnosis not present

## 2024-09-28 DIAGNOSIS — Z79899 Other long term (current) drug therapy: Secondary | ICD-10-CM | POA: Diagnosis not present

## 2024-09-28 HISTORY — DX: Pure hypercholesterolemia, unspecified: E78.00

## 2024-09-28 LAB — CBC WITH DIFFERENTIAL/PLATELET
Abs Immature Granulocytes: 0.01 K/uL (ref 0.00–0.07)
Basophils Absolute: 0 K/uL (ref 0.0–0.1)
Basophils Relative: 1 %
Eosinophils Absolute: 0 K/uL (ref 0.0–0.5)
Eosinophils Relative: 1 %
HCT: 42.2 % (ref 39.0–52.0)
Hemoglobin: 13.9 g/dL (ref 13.0–17.0)
Immature Granulocytes: 0 %
Lymphocytes Relative: 23 %
Lymphs Abs: 1.4 K/uL (ref 0.7–4.0)
MCH: 26 pg (ref 26.0–34.0)
MCHC: 32.9 g/dL (ref 30.0–36.0)
MCV: 78.9 fL — ABNORMAL LOW (ref 80.0–100.0)
Monocytes Absolute: 0.3 K/uL (ref 0.1–1.0)
Monocytes Relative: 5 %
Neutro Abs: 4.4 K/uL (ref 1.7–7.7)
Neutrophils Relative %: 70 %
Platelets: 209 K/uL (ref 150–400)
RBC: 5.35 MIL/uL (ref 4.22–5.81)
RDW: 15.5 % (ref 11.5–15.5)
WBC: 6.2 K/uL (ref 4.0–10.5)
nRBC: 0 % (ref 0.0–0.2)

## 2024-09-28 LAB — URINALYSIS, ROUTINE W REFLEX MICROSCOPIC
Bacteria, UA: NONE SEEN
Bilirubin Urine: NEGATIVE
Glucose, UA: >=500 mg/dL — AB
Hgb urine dipstick: NEGATIVE
Ketones, ur: 20 mg/dL — AB
Leukocytes,Ua: NEGATIVE
Nitrite: NEGATIVE
Protein, ur: NEGATIVE mg/dL
Specific Gravity, Urine: 1.006 (ref 1.005–1.030)
pH: 8 (ref 5.0–8.0)

## 2024-09-28 LAB — COMPREHENSIVE METABOLIC PANEL WITH GFR
ALT: 19 U/L (ref 0–44)
AST: 33 U/L (ref 15–41)
Albumin: 4.7 g/dL (ref 3.5–5.0)
Alkaline Phosphatase: 89 U/L (ref 38–126)
Anion gap: 17 — ABNORMAL HIGH (ref 5–15)
BUN: 8 mg/dL (ref 8–23)
CO2: 22 mmol/L (ref 22–32)
Calcium: 9.2 mg/dL (ref 8.9–10.3)
Chloride: 100 mmol/L (ref 98–111)
Creatinine, Ser: 0.75 mg/dL (ref 0.61–1.24)
GFR, Estimated: >60 mL/min (ref >60)
Glucose, Bld: 228 mg/dL — ABNORMAL HIGH (ref 70–99)
Potassium: 3.6 mmol/L (ref 3.5–5.1)
Sodium: 139 mmol/L (ref 135–145)
Total Bilirubin: 0.5 mg/dL (ref 0.0–1.2)
Total Protein: 7 g/dL (ref 6.5–8.1)

## 2024-09-28 LAB — GLUCOSE, CAPILLARY
Glucose-Capillary: 185 mg/dL — ABNORMAL HIGH (ref 70–99)
Glucose-Capillary: 109 mg/dL — ABNORMAL HIGH (ref 70–99)

## 2024-09-28 LAB — URINE DRUG SCREEN
Amphetamines: NEGATIVE
Barbiturates: NEGATIVE
Benzodiazepines: NEGATIVE
Cocaine: NEGATIVE
Fentanyl: NEGATIVE
Methadone Scn, Ur: NEGATIVE
Opiates: NEGATIVE
Tetrahydrocannabinol: POSITIVE — AB

## 2024-09-28 LAB — TROPONIN T, HIGH SENSITIVITY
Troponin T High Sensitivity: 16 ng/L (ref 0–19)
Troponin T High Sensitivity: <15 ng/L (ref 0–19)

## 2024-09-28 MED ORDER — DIPHENHYDRAMINE HCL 50 MG/ML IJ SOLN
25.0000 mg | Freq: Once | INTRAMUSCULAR | Status: AC
  Administered 2024-09-28: 25 mg via INTRAVENOUS
  Filled 2024-09-28: qty 1

## 2024-09-28 MED ORDER — INSULIN ASPART 100 UNIT/ML IJ SOLN
0.0000 [IU] | Freq: Three times a day (TID) | INTRAMUSCULAR | Status: DC
  Administered 2024-09-28: 3 [IU] via SUBCUTANEOUS
  Administered 2024-09-29: 17:00:00 5 [IU] via SUBCUTANEOUS
  Administered 2024-09-29: 08:00:00 3 [IU] via SUBCUTANEOUS
  Filled 2024-09-28 (×3): qty 1

## 2024-09-28 MED ORDER — HYDRALAZINE HCL 20 MG/ML IJ SOLN: 10.0000 mg | Freq: Four times a day (QID) | INTRAMUSCULAR | Status: DC | PRN

## 2024-09-28 MED ORDER — SENNOSIDES-DOCUSATE SODIUM 8.6-50 MG PO TABS: 1.0000 | ORAL_TABLET | Freq: Every evening | ORAL | Status: DC | PRN

## 2024-09-28 MED ORDER — ENOXAPARIN SODIUM 40 MG/0.4ML IJ SOSY
40.0000 mg | PREFILLED_SYRINGE | INTRAMUSCULAR | Status: DC
  Administered 2024-09-28: 40 mg via SUBCUTANEOUS
  Filled 2024-09-28: qty 0.4

## 2024-09-28 MED ORDER — ACETAMINOPHEN 650 MG RE SUPP: 650.0000 mg | RECTAL | Status: DC | PRN

## 2024-09-28 MED ORDER — ASPIRIN 81 MG PO CHEW
324.0000 mg | CHEWABLE_TABLET | Freq: Once | ORAL | Status: AC
  Administered 2024-09-28: 324 mg via ORAL
  Filled 2024-09-28: qty 4

## 2024-09-28 MED ORDER — AMLODIPINE BESYLATE 5 MG PO TABS
10.0000 mg | ORAL_TABLET | Freq: Once | ORAL | Status: AC
  Administered 2024-09-28: 10 mg via ORAL
  Filled 2024-09-28: qty 2

## 2024-09-28 MED ORDER — LOSARTAN POTASSIUM 25 MG PO TABS
25.0000 mg | ORAL_TABLET | Freq: Every day | ORAL | Status: DC
  Administered 2024-09-28 – 2024-09-29 (×2): 25 mg via ORAL
  Filled 2024-09-28 (×2): qty 1

## 2024-09-28 MED ORDER — ACETAMINOPHEN 325 MG PO TABS
650.0000 mg | ORAL_TABLET | ORAL | Status: DC | PRN
  Administered 2024-09-29: 03:00:00 650 mg via ORAL
  Filled 2024-09-28: qty 2

## 2024-09-28 MED ORDER — IOHEXOL 350 MG/ML SOLN
75.0000 mL | Freq: Once | INTRAVENOUS | Status: AC | PRN
  Administered 2024-09-28: 75 mL via INTRAVENOUS

## 2024-09-28 MED ORDER — SODIUM CHLORIDE 0.9 % IV SOLN: INTRAVENOUS | Status: AC

## 2024-09-28 MED ORDER — CLOPIDOGREL BISULFATE 75 MG PO TABS
75.0000 mg | ORAL_TABLET | Freq: Every day | ORAL | Status: DC
  Administered 2024-09-28 – 2024-09-29 (×2): 75 mg via ORAL
  Filled 2024-09-28 (×2): qty 1

## 2024-09-28 MED ORDER — ACETAMINOPHEN 160 MG/5ML PO SOLN: 650.0000 mg | ORAL | Status: DC | PRN

## 2024-09-28 MED ORDER — STROKE: EARLY STAGES OF RECOVERY BOOK: Freq: Once | Status: AC

## 2024-09-28 MED ORDER — AMLODIPINE BESYLATE 5 MG PO TABS: 5.0000 mg | ORAL_TABLET | Freq: Every day | ORAL | Status: DC

## 2024-09-28 MED ORDER — INSULIN ASPART 100 UNIT/ML IJ SOLN: 0.0000 [IU] | Freq: Every day | INTRAMUSCULAR | Status: DC

## 2024-09-28 MED ORDER — METOCLOPRAMIDE HCL 5 MG/ML IJ SOLN
10.0000 mg | Freq: Once | INTRAMUSCULAR | Status: AC
  Administered 2024-09-28: 10 mg via INTRAVENOUS
  Filled 2024-09-28: qty 2

## 2024-09-28 MED ORDER — PRAVASTATIN SODIUM 10 MG PO TABS
20.0000 mg | ORAL_TABLET | Freq: Every day | ORAL | Status: DC
  Administered 2024-09-28 – 2024-09-29 (×2): 20 mg via ORAL
  Filled 2024-09-28 (×2): qty 2

## 2024-09-28 MED ORDER — AMLODIPINE BESYLATE 5 MG PO TABS
10.0000 mg | ORAL_TABLET | Freq: Every day | ORAL | Status: DC
  Administered 2024-09-29: 08:00:00 10 mg via ORAL
  Filled 2024-09-28: qty 2

## 2024-09-28 NOTE — Progress Notes (Signed)
 SLP Cancellation Note  Patient Details Name: Evan Estes MRN: [id number] DOB: 1960/12/17   Cancelled treatment:       Reason Eval/Treat Not Completed: SLP screened, no needs identified, will sign off. Pt's speech, language and cognition are functioning at baseline, thank you for this referral.  Keonta Alsip H. Clois KILLIAN, CCC-SLP Speech Language Pathologist    Raguel VEAR Clois 09/28/2024, 3:39 PM

## 2024-09-28 NOTE — ED Provider Notes (Signed)
 [place] EMERGENCY DEPARTMENT AT [facility] Provider Note   CSN: [id number] Arrival date & time: 09/28/24  1132     Patient presents with: Hypertension and Headache   Evan Estes is a 63 y.o. male.   Patient is a 63 year old male who presents to the emergency department with a chief complaint of headache and high blood pressure.  Patient notes that he has been out of his amlodipine  for approximate the past week.  He notes that he has been taking olmesartan  which he had leftover but has continued to have elevated blood pressures.  Patient notes that he has had no associated chest pain or shortness of breath.  He denies any palpitations.  He has had no numbness, paresthesias or unilateral weakness.  He notes that the headache has improved since onset.  He denies any thunderclap nature of the headache.  He has had no visual or hearing changes.  He denies any pain to neck or back.   Hypertension Associated symptoms include headaches.  Headache      Prior to Admission medications  Medication Sig Start Date End Date Taking? Authorizing Provider  famotidine (PEPCID) 40 MG tablet Take 40 mg by mouth daily. Patient not taking: Reported on 06/02/2024    [provider]  glipiZIDE (GLUCOTROL XL) 10 MG 24 hr tablet Take 10 mg by mouth daily. Patient not taking: Reported on 06/02/2024 02/21/23   [provider]  linaclotide  (LINZESS ) 290 MCG CAPS capsule Take 1 capsule (290 mcg total) by mouth daily before breakfast. 06/02/24   Bacchus, Gloria Z, FNP  lovastatin  (MEVACOR ) 20 MG tablet Take 1 tablet (20 mg total) by mouth daily. 06/02/24   Bacchus, Meade PEDLAR, FNP  metFORMIN  (GLUCOPHAGE ) 500 MG tablet Take 2 tablets (1,000 mg total) by mouth 2 (two) times daily with a meal. 06/02/24   Bacchus, Meade PEDLAR, FNP  Olmesartan -amLODIPine -HCTZ 40-10-12.5 MG TABS Take 1 tablet by mouth daily. 07/15/24   Bacchus, Gloria Z, FNP  Semaglutide ,0.25 or 0.5MG /DOS, (OZEMPIC , 0.25 OR 0.5  MG/DOSE,) 2 MG/3ML SOPN Inject 0.25 mg into the skin once a week. 06/03/24   Bacchus, Meade PEDLAR, FNP  Vitamin D , Ergocalciferol , (DRISDOL ) 1.25 MG (50000 UNIT) CAPS capsule Take 1 capsule (50,000 Units total) by mouth every 7 (seven) days. 06/03/24   Bacchus, Meade PEDLAR, FNP    Allergies: Patient has no known allergies.    Review of Systems  Neurological:  Positive for headaches.  All other systems reviewed and are negative.   Updated Vital Signs BP (!) 176/85   Pulse 69   Resp 18   Ht 5' 8 (1.727 m)   Wt 65.8 kg   SpO2 100%   BMI 22.05 kg/m   Physical Exam Vitals and nursing note reviewed.  Constitutional:      General: He is not in acute distress.    Appearance: Normal appearance. He is not ill-appearing.  HENT:     Head: Normocephalic and atraumatic.     Nose: Nose normal.     Mouth/Throat:     Mouth: Mucous membranes are moist.  Eyes:     General: No visual field deficit.    Extraocular Movements: Extraocular movements intact.     Right eye: No nystagmus.     Left eye: No nystagmus.     Conjunctiva/sclera: Conjunctivae normal.     Pupils: Pupils are equal, round, and reactive to light.     Right eye: Pupil is round and reactive.     Left  eye: Pupil is round and reactive.  Cardiovascular:     Rate and Rhythm: Normal rate and regular rhythm.     Pulses: Normal pulses.     Heart sounds: Normal heart sounds.  Pulmonary:     Effort: Pulmonary effort is normal. No respiratory distress.     Breath sounds: Normal breath sounds. No stridor. No wheezing, rhonchi or rales.  Abdominal:     General: Abdomen is flat. Bowel sounds are normal. There is no distension.     Palpations: Abdomen is soft.     Tenderness: There is no abdominal tenderness.  Musculoskeletal:        General: No swelling or tenderness. Normal range of motion.     Cervical back: Normal range of motion and neck supple. No rigidity.  Lymphadenopathy:     Cervical: No cervical adenopathy.  Skin:    General:  Skin is warm and dry.  Neurological:     General: No focal deficit present.     Mental Status: He is alert and oriented to person, place, and time. Mental status is at baseline.     GCS: GCS eye subscore is 4. GCS verbal subscore is 5. GCS motor subscore is 6.     Cranial Nerves: No cranial nerve deficit, dysarthria or facial asymmetry.     Sensory: No sensory deficit.     Motor: No weakness.     Gait: Gait normal.  Psychiatric:        Mood and Affect: Mood normal. Mood is not anxious or depressed.        Speech: Speech normal.        Behavior: Behavior normal. Behavior is not agitated.        Thought Content: Thought content normal.        Cognition and Memory: Cognition is not impaired. Memory is not impaired.        Judgment: Judgment normal.     (all labs ordered are listed, but only abnormal results are displayed) Labs Reviewed  COMPREHENSIVE METABOLIC PANEL WITH GFR  CBC WITH DIFFERENTIAL/PLATELET  URINALYSIS, ROUTINE W REFLEX MICROSCOPIC  TROPONIN T, HIGH SENSITIVITY    EKG: None  Radiology: No results found.   Procedures   Medications Ordered in the ED  metoCLOPramide  (REGLAN ) injection 10 mg (has no administration in time range)  diphenhydrAMINE  (BENADRYL ) injection 25 mg (has no administration in time range)  amLODipine  (NORVASC ) tablet 10 mg (10 mg Oral Given 09/28/24 1203)                                    Medical Decision Making Amount and/or Complexity of Data Reviewed Labs: ordered. Radiology: ordered.  Risk OTC drugs. Prescription drug management. Decision regarding hospitalization.   This patient presents to the ED for concern of headache, hypertension, this involves an extensive number of treatment options, and is a complaint that carries with it a high risk of complications and morbidity.  The differential diagnosis includes hypertensive emergency/urgency, endorgan failure, ACS, CVA, TIA, subarachnoid hemorrhage   Co morbidities that  complicate the patient evaluation  Hypertension   Additional history obtained:  Additional history obtained from none External records from outside source obtained and reviewed including none   Lab Tests:  I Ordered, and personally interpreted labs.  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  I personally viewed and interpreted the cardiac monitored which showed an underlying rhythm of: Sinus bradycardia, no ST/T wave changes, no ischemic changes, no STEMI   Consultations Obtained:  I requested consultation with the hospitalist, neurology, Dr. Germaine,  and discussed lab and imaging findings as well as pertinent plan - they recommend: Admission for further stroke workup   Problem List / ED Course / Critical interventions / Medication management  Patient is doing well at this time and does remain stable.  He continues to have no neurological deficits on exam.  Did discuss patient CTA findings with Dr. Germaine with neurology who was in agreement for admission for further stroke workup at this time given his abnormal CT scan findings.  Patient does continue have elevated blood pressure but will allow for permissive hypertension at this point.  He was given a dose of his home amlodipine  which he has been out of for  approximate the past week.  Headache has improved at this time.  Blood work is otherwise unremarkable with no indication for endorgan dysfunction.  Have discussed patient case with Dr. Evonnie with the hospitalist service who has excepted for admission. I ordered medication including aspirin , Reglan , Benadryl , amlodipine  for hypertension, subacute infarct, headache Reevaluation of the patient after these medicines showed that the patient improved I have reviewed the patients home medicines and have made adjustments as needed   Social Determinants of Health:  None   Test / Admission - Considered:  Admission     Final diagnoses:  None    ED Discharge Orders     None          Daralene Lonni JONETTA DEVONNA 09/28/24 1422    Suzette Pac, MD 09/29/24 1112

## 2024-09-28 NOTE — ED Notes (Signed)
 Patient transported to MRI

## 2024-09-28 NOTE — Hospital Course (Signed)
 63 year old male with a history diabetes mellitus type 2, hypertension, hyperlipidemia, BPH, constipation presenting with headache, high blood pressure, and vomiting. The patient states that he woke up around 8 AM on 09/28/2024 with an occipital headache.  He went outside to get some fresh air and walk around.  He had 3 episodes of emesis.  There was no hematemesis.  The patient went back to his house and took a nap.  He woke up around 1030 and still had an occipital headache.  He checked his blood pressure and it was 190/90.  As result, he presented to the emergency department for further evaluation and treatment. The patient states that he had been in his usual state of health this past week without any complaints prior to today's events.  He denies any visual disturbance, facial droop, speech disturbance.  He denies any focal extremity weakness or dysesthesias.  He denies any dizziness or syncope or falls. He states that around Thanksgiving 2024, he did have an episode of left hand weakness which lasted about 3 months after which he returned back to normal.  He has not had any other focal weakness since that time. Patient denies fevers, chills, headache, chest pain, dyspnea, nausea, vomiting, diarrhea, abdominal pain, dysuria, hematuria, hematochezia, and melena.  Notably, the patient states he had run out of his amlodipine  for a little over a week.  He had also run out of his lovastatin  and metformin .  He states that he had some leftover olmesartan  40 mg which he began taking, but this did not help his blood pressure.  He used to drink beer on daily basis, but has not drank any beer since 2022.  He quit smoking in 2022 after about 20-pack-year history.  He denies any illicit drugs.  In the ED, the patient was afebrile and hemodynamically stable with oxygen saturation 99% room air.  WBC 6.2, hemoglobin 13.9, platelet 209.  Sodium 139, potassium 3.6, bicarbonate 22, serum creatinine 0.75.  AST 33, ALT  19, alk phosphatase 89, total bilirubin 0.5, albumin 4.7.  UA 0-5 WBC.  EKG showed sinus rhythm nonspecific T wave changes. CTA H&N suspect subacute right parietal infarct with a small or late subacute to chronic infarct of the right frontal lobe.  80% stenosis right ICA.  Neurology was consulted and recommended admission for stroke workup.

## 2024-09-28 NOTE — ED Notes (Signed)
 Patient transported to CT

## 2024-09-28 NOTE — Progress Notes (Signed)
 Patients family come to pick up patients home medications. Family member Dorothe picked medications up.

## 2024-09-28 NOTE — Progress Notes (Signed)
°   09/28/24 2130  Assess: MEWS Score  Temp 98.5 F (36.9 C)  BP (!) 206/90  MAP (mmHg) 120  Pulse Rate 81  SpO2 97 %  O2 Device Room Air  Assess: MEWS Score  MEWS Temp 0  MEWS Systolic 2  MEWS Pulse 0  MEWS RR 1  MEWS LOC 0  MEWS Score 3  MEWS Score Color Yellow  Assess: if the MEWS score is Yellow or Red  Were vital signs accurate and taken at a resting state? Yes  Does the patient meet 2 or more of the SIRS criteria? No  MEWS guidelines implemented  Yes, yellow  Treat  MEWS Interventions Considered administering scheduled or prn medications/treatments as ordered  Take Vital Signs  Increase Vital Sign Frequency  Yellow: Q2hr x1, continue Q4hrs until patient remains green for 12hrs  Escalate  MEWS: Escalate Yellow: Discuss with charge nurse and consider notifying provider and/or RRT  Notify: Charge Nurse/RN  Name of Charge Nurse/RN Notified Nancy,RN (Permissive HTN per provider, prn to be given SBP>220, pt asymptomatic)  Assess: SIRS CRITERIA  SIRS Temperature  0  SIRS Respirations  1  SIRS Pulse 0  SIRS WBC 0  SIRS Score Sum  1

## 2024-09-28 NOTE — H&P (Signed)
 History and Physical    Patient: Evan Estes FMW:[id number] DOB: Apr 09, 1961 DOA: 09/28/2024 DOS: the patient was seen and examined on 09/28/2024 PCP: Edman Meade PEDLAR, FNP  Patient coming from: Home  Chief Complaint:  Chief Complaint  Patient presents with   Hypertension   Headache   HPI: Evan Estes is a 63 year old male with a history diabetes mellitus type 2, hypertension, hyperlipidemia, BPH, constipation presenting with headache, high blood pressure, and vomiting. The patient states that he woke up around 8 AM on 09/28/2024 with an occipital headache.  He went outside to get some fresh air and walk around.  He had 3 episodes of emesis.  There was no hematemesis.  The patient went back to his house and took a nap.  He woke up around 1030 and still had an occipital headache.  He checked his blood pressure and it was 190/90.  As result, he presented to the emergency department for further evaluation and treatment. The patient states that he had been in his usual state of health this past week without any complaints prior to today's events.  He denies any visual disturbance, facial droop, speech disturbance.  He denies any focal extremity weakness or dysesthesias.  He denies any dizziness or syncope or falls. He states that around Thanksgiving 2024, he did have an episode of left hand weakness which lasted about 3 months after which he returned back to normal.  He has not had any other focal weakness since that time. Patient denies fevers, chills, headache, chest pain, dyspnea, nausea, vomiting, diarrhea, abdominal pain, dysuria, hematuria, hematochezia, and melena.  Notably, the patient states he had run out of his amlodipine  for a little over a week.  He had also run out of his lovastatin  and metformin .  He states that he had some leftover olmesartan  40 mg which he began taking, but this did not help his blood pressure.  He used to drink beer on daily basis, but has not drank any beer  since 2022.  He quit smoking in 2022 after about 20-pack-year history.  He denies any illicit drugs.  In the ED, the patient was afebrile and hemodynamically stable with oxygen saturation 99% room air.  WBC 6.2, hemoglobin 13.9, platelet 209.  Sodium 139, potassium 3.6, bicarbonate 22, serum creatinine 0.75.  AST 33, ALT 19, alk phosphatase 89, total bilirubin 0.5, albumin 4.7.  UA 0-5 WBC.  EKG showed sinus rhythm nonspecific T wave changes. CTA H&N suspect subacute right parietal infarct with a small or late subacute to chronic infarct of the right frontal lobe.  80% stenosis right ICA.  Neurology was consulted and recommended admission for stroke workup.  Review of Systems: As mentioned in the history of present illness. All other systems reviewed and are negative. Past Medical History:  Diagnosis Date   Diabetes mellitus    High cholesterol    Hypertension    Past Surgical History:  Procedure Laterality Date   COLONOSCOPY  09/27/2011   Procedure: COLONOSCOPY;  Surgeon: Claudis RAYMOND Rivet, MD;  Location: [facility];  Service: Endoscopy;  Laterality: N/A;  10:45   Social History:  reports that he has quit smoking. His smoking use included cigars. He has never used smokeless tobacco. He reports current alcohol use. He reports current drug use.  Allergies[1]  Family History  Problem Relation Age of Onset   Anesthesia problems Neg Hx     Prior to Admission medications  Medication Sig Start Date End Date Taking? Authorizing Provider  famotidine (PEPCID) 40 MG tablet Take 40 mg by mouth daily. Patient not taking: Reported on 06/02/2024    [provider]  glipiZIDE (GLUCOTROL XL) 10 MG 24 hr tablet Take 10 mg by mouth daily. Patient not taking: Reported on 06/02/2024 02/21/23   [provider]  linaclotide  (LINZESS ) 290 MCG CAPS capsule Take 1 capsule (290 mcg total) by mouth daily before breakfast. 06/02/24   Bacchus, Meade PEDLAR, FNP  lovastatin  (MEVACOR ) 20 MG tablet Take 1  tablet (20 mg total) by mouth daily. 06/02/24   Bacchus, Meade PEDLAR, FNP  metFORMIN  (GLUCOPHAGE ) 500 MG tablet Take 2 tablets (1,000 mg total) by mouth 2 (two) times daily with a meal. 06/02/24   Bacchus, Meade PEDLAR, FNP  Olmesartan -amLODIPine -HCTZ 40-10-12.5 MG TABS Take 1 tablet by mouth daily. 07/15/24   Bacchus, Meade PEDLAR, FNP  Semaglutide ,0.25 or 0.5MG /DOS, (OZEMPIC , 0.25 OR 0.5 MG/DOSE,) 2 MG/3ML SOPN Inject 0.25 mg into the skin once a week. 06/03/24   Bacchus, Meade PEDLAR, FNP  Vitamin D , Ergocalciferol , (DRISDOL ) 1.25 MG (50000 UNIT) CAPS capsule Take 1 capsule (50,000 Units total) by mouth every 7 (seven) days. 06/03/24   Edman Meade PEDLAR, FNP    Physical Exam: Vitals:   09/28/24 1230 09/28/24 1330 09/28/24 1345 09/28/24 1415  BP: (!) 179/76 (!) 155/83 (!) 186/95   Pulse: (!) 52 (!) 58 82 71  Resp: 12 20 (!) 27 17  SpO2: 94% 96% 97% 95%  Weight:      Height:       GENERAL:  A&O x 3, NAD, well developed, cooperative, follows commands HEENT: [place]/AT, No thrush, No icterus, No oral ulcers Neck:  No neck mass, No meningismus, soft, supple CV: RRR, no S3, no S4, no rub, no JVD Lungs:  CTA, no wheeze, no rhonchi, good air movement Abd: soft/NT +BS, nondistended Ext: No edema, no lymphangitis, no cyanosis, no rashes Neuro:  CN II-XII intact, strength 4/5 in RUE, RLE, strength 4/5 LUE, LLE; sensation intact bilateral; no dysmetria; babinski equivocal  Data Reviewed: Data reviewed above in the history  Assessment and Plan: Acute ischemic stroke -Appreciate Neurology Consult -PT/OT evaluation -Speech therapy eval -MRI brain-- -CTA H&N--subacute right parietal infarct.  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  80% stenosis proximal right ICA -Echo-- -LDL-- -HbA1C-- -Antiplatelet--aspirin  81 & Plavix  75 mg -UDS  Uncontrolled diabetes mellitus type 2 with hyperglycemia - 06/02/2024 hemoglobin A1c 9.6 - NovoLog  sliding scale - Recheck A1c - Holding metformin   Essential  hypertension - Holding BP meds temporarily to allow for permissive hypertension - Hydralazine  prn SBP > 220/120  Mixed hyperlipidemia - Continue statin - Check lipid panel    Advance Care Planning: FULL  Consults: neurology  Family Communication: none  Severity of Illness: The appropriate patient status for this patient is OBSERVATION. Observation status is judged to be reasonable and necessary in order to provide the required intensity of service to ensure the patient's safety. The patient's presenting symptoms, physical exam findings, and initial radiographic and laboratory data in the context of their medical condition is felt to place them at decreased risk for further clinical deterioration. Furthermore, it is anticipated that the patient will be medically stable for discharge from the hospital within 2 midnights of admission.   Author: Alm Schneider, MD 09/28/2024 2:35 PM  For on call review [email or web address].     [1] No Known Allergies

## 2024-09-28 NOTE — ED Triage Notes (Addendum)
 Pt reports elevated blood pressure x a few days and headache starting today.  Pain score 8/10.  Pt reports he has not been taking his HTN medications because he ran out.  Pt reports PCP appointment in January.   Pt reports emesis x2 prior to arrival.  Pt did try to take some old HTN medication this morning.

## 2024-09-29 ENCOUNTER — Observation Stay (HOSPITAL_COMMUNITY)

## 2024-09-29 DIAGNOSIS — I10 Essential (primary) hypertension: Secondary | ICD-10-CM

## 2024-09-29 DIAGNOSIS — I6521 Occlusion and stenosis of right carotid artery: Secondary | ICD-10-CM

## 2024-09-29 LAB — ECHOCARDIOGRAM COMPLETE
AR max vel: 2.27 cm2
AV Area VTI: 2.33 cm2
AV Area mean vel: 2.34 cm2
AV Mean grad: 4.2 mmHg
AV Peak grad: 9.1 mmHg
Ao pk vel: 1.51 m/s
Area-P 1/2: 2.73 cm2
Height: 67 in
S' Lateral: 3.3 cm
Weight: 2285.73 [oz_av]

## 2024-09-29 LAB — BASIC METABOLIC PANEL WITH GFR
Anion gap: 8 (ref 5–15)
BUN: 6 mg/dL — ABNORMAL LOW (ref 8–23)
CO2: 30 mmol/L (ref 22–32)
Calcium: 9.3 mg/dL (ref 8.9–10.3)
Chloride: 103 mmol/L (ref 98–111)
Creatinine, Ser: 0.85 mg/dL (ref 0.61–1.24)
GFR, Estimated: >60 mL/min (ref >60)
Glucose, Bld: 215 mg/dL — ABNORMAL HIGH (ref 70–99)
Potassium: 3.5 mmol/L (ref 3.5–5.1)
Sodium: 141 mmol/L (ref 135–145)

## 2024-09-29 LAB — LIPID PANEL
Cholesterol: 142 mg/dL (ref 0–200)
HDL: 73 mg/dL (ref >40)
LDL Cholesterol: 59 mg/dL (ref 0–99)
Total CHOL/HDL Ratio: 2 ratio
Triglycerides: 53 mg/dL (ref <150)
VLDL: 11 mg/dL (ref 0–40)

## 2024-09-29 LAB — GLUCOSE, CAPILLARY
Glucose-Capillary: 197 mg/dL — ABNORMAL HIGH (ref 70–99)
Glucose-Capillary: 95 mg/dL (ref 70–99)
Glucose-Capillary: 242 mg/dL — ABNORMAL HIGH (ref 70–99)

## 2024-09-29 LAB — HEMOGLOBIN A1C
Hgb A1c MFr Bld: 7 % — ABNORMAL HIGH (ref 4.8–5.6)
Mean Plasma Glucose: 154.2 mg/dL

## 2024-09-29 LAB — MAGNESIUM: Magnesium: 1.9 mg/dL (ref 1.7–2.4)

## 2024-09-29 MED ORDER — METOPROLOL TARTRATE 25 MG PO TABS
12.5000 mg | ORAL_TABLET | Freq: Two times a day (BID) | ORAL | Status: DC
  Administered 2024-09-29: 10:00:00 12.5 mg via ORAL
  Filled 2024-09-29: qty 1

## 2024-09-29 MED ORDER — LOSARTAN POTASSIUM 50 MG PO TABS: 50.0000 mg | ORAL_TABLET | Freq: Every day | ORAL | 2 refills | Status: DC

## 2024-09-29 MED ORDER — LOVASTATIN 20 MG PO TABS: 20.0000 mg | ORAL_TABLET | Freq: Every day | ORAL | 2 refills | Status: DC

## 2024-09-29 MED ORDER — AMLODIPINE BESYLATE 10 MG PO TABS: 10.0000 mg | ORAL_TABLET | Freq: Every day | ORAL | 2 refills | Status: DC

## 2024-09-29 MED ORDER — LOSARTAN POTASSIUM 50 MG PO TABS: 50.0000 mg | ORAL_TABLET | Freq: Every day | ORAL | Status: DC

## 2024-09-29 MED ORDER — METOPROLOL TARTRATE 25 MG PO TABS: 25.0000 mg | ORAL_TABLET | Freq: Two times a day (BID) | ORAL | 2 refills | Status: DC

## 2024-09-29 MED ORDER — METFORMIN HCL 500 MG PO TABS: 1000.0000 mg | ORAL_TABLET | Freq: Two times a day (BID) | ORAL | 2 refills | Status: DC

## 2024-09-29 MED ORDER — PERFLUTREN LIPID MICROSPHERE
1.0000 mL | INTRAVENOUS | Status: DC | PRN
  Administered 2024-09-29: 16:00:00 3 mL via INTRAVENOUS

## 2024-09-29 MED ORDER — METOPROLOL TARTRATE 25 MG PO TABS: 25.0000 mg | ORAL_TABLET | Freq: Two times a day (BID) | ORAL | Status: DC

## 2024-09-29 MED ORDER — ASPIRIN 81 MG PO CHEW
81.0000 mg | CHEWABLE_TABLET | Freq: Every day | ORAL | Status: DC
  Administered 2024-09-29: 18:00:00 81 mg via ORAL
  Filled 2024-09-29: qty 1

## 2024-09-29 MED ORDER — ASPIRIN 81 MG PO CHEW: 81.0000 mg | CHEWABLE_TABLET | Freq: Every day | ORAL | Status: AC

## 2024-09-29 NOTE — Progress Notes (Signed)
 Echocardiogram 2D Echocardiogram has been performed.  Evan Estes 09/29/2024, 10:24 AM

## 2024-09-29 NOTE — Progress Notes (Signed)
°  Transition of Care [facility]) Screening Note   Patient Details  Name: GERARD CANTARA Date of Birth: Apr 12, 1961   Transition of Care [facility]) CM/SW Contact:    Hoy DELENA Bigness, LCSW Phone Number: 09/29/2024, 9:45 AM    Transition of Care Department [facility]) has reviewed patient and no TOC needs have been identified at this time. We will continue to monitor patient advancement through interdisciplinary progression rounds. If new patient transition needs arise, please place a TOC consult.    09/29/24 0942  TOC Brief Assessment  Insurance and Status Reviewed  Patient has primary care physician Yes  Home environment has been reviewed Single family home  Prior level of function: Independent  Prior/Current Home Services No current home services  Social Drivers of Health Review SDOH reviewed no interventions necessary  Readmission risk has been reviewed Yes  Transition of care needs no transition of care needs at this time

## 2024-09-29 NOTE — Progress Notes (Signed)
°   09/29/24 0012  Pain Assessment  Pain Scale 0-10  Pain Score 0  Provider Notification  Provider Name/Title Dr. Manfred  Date Provider Notified 09/29/24  Time Provider Notified 0005  Method of Notification  (secure chat)  Notification Reason Other (Comment) (Pt noted with 13 beat run of afib. BP 182/106 (has been running high since admission, permissible HTN per provider) HR-87. Asymptomatic)  Provider response Other (Comment) (continue to monitor)  Date of Provider Response 09/29/24  Time of Provider Response [id number]

## 2024-09-29 NOTE — Evaluation (Signed)
 Occupational Therapy Evaluation Patient Details Name: Evan Estes MRN: [id number] DOB: [birth year] Today's Date: 09/29/2024   History of Present Illness   Evan Estes is a 63 year old male with a history diabetes mellitus type 2, hypertension, hyperlipidemia, BPH, constipation presenting with headache, high blood pressure, and vomiting.  The patient states that he woke up around 8 AM on 09/28/2024 with an occipital headache.  He went outside to get some fresh air and walk around.  He had 3 episodes of emesis.  There was no hematemesis.  The patient went back to his house and took a nap.  He woke up around 1030 and still had an occipital headache.  He checked his blood pressure and it was 190/90.  As result, he presented to the emergency department for further evaluation and treatment.  The patient states that he had been in his usual state of health this past week without any complaints prior to today's events.  He denies any visual disturbance, facial droop, speech disturbance.  He denies any focal extremity weakness or dysesthesias.  He denies any dizziness or syncope or falls.  He states that around Thanksgiving 2024, he did have an episode of left hand weakness which lasted about 3 months after which he returned back to normal.  He has not had any other focal weakness since that time.  Patient denies fevers, chills, headache, chest pain, dyspnea, nausea, vomiting, diarrhea, abdominal pain, dysuria, hematuria, hematochezia, and melena. (per MD)     Clinical Impressions Pt agreeable to PT and OT co-evaluation. Pt appears to be at or near baseline function for ADL's and functional mobility. Mild proprioception/GM coordination deficit with B UE, but not significant. Pt is able to complete ADL's without assist and reports being back to normal. Pt able to ambulate in the hall without AD. Pt left in the bed with call bell within reach. Pt is not recommended for any further acute OT services and will be  discharged to care of nursing staff for remaining length of stay.                Functional Status Assessment   Patient has not had a recent decline in their functional status     Equipment Recommendations   None recommended by OT             Precautions/Restrictions   Precautions Precautions: Fall Recall of Precautions/Restrictions: Intact Restrictions Weight Bearing Restrictions Per Provider Order: No     Mobility Bed Mobility Overal bed mobility: Independent                  Transfers Overall transfer level: Independent                 General transfer comment: Able to functionally transfer without AD.      Balance Overall balance assessment: Independent                                         ADL either performed or assessed with clinical judgement   ADL Overall ADL's : Independent                                             Vision Baseline Vision/History: 1 Wears glasses Ability to See in Adequate Light: 0  Adequate Patient Visual Report: Blurring of vision (Has since returned to normal.) Vision Assessment?: No apparent visual deficits     Perception Perception: Impaired Preception Impairment Details:  (Very mild impairment via finger to nost test.)     Praxis Praxis: Not tested       Pertinent Vitals/Pain Pain Assessment Pain Assessment: No/denies pain     Extremity/Trunk Assessment Upper Extremity Assessment Upper Extremity Assessment: Overall WFL for tasks assessed   Lower Extremity Assessment Lower Extremity Assessment: Defer to PT evaluation   Cervical / Trunk Assessment Cervical / Trunk Assessment: Normal   Communication Communication Communication: No apparent difficulties   Cognition Arousal: Alert Behavior During Therapy: WFL for tasks assessed/performed Cognition: No apparent impairments                               Following commands: Intact        Cueing  General Comments   Cueing Techniques: Verbal cues                 Home Living Family/patient expects to be discharged to:: Private residence Living Arrangements: Alone Available Help at Discharge: Family;Available PRN/intermittently Type of Home: Mobile home Home Access: Stairs to enter Entrance Stairs-Number of Steps: 4 Entrance Stairs-Rails: None Home Layout: One level     Bathroom Shower/Tub: Chief Strategy Officer: Standard Bathroom Accessibility: No   Home Equipment: None          Prior Functioning/Environment Prior Level of Function : Independent/Modified Independent             Mobility Comments: Community ambulator without AD. ADLs Comments: Independent.                            Co-evaluation PT/OT/SLP Co-Evaluation/Treatment: Yes Reason for Co-Treatment: To address functional/ADL transfers   OT goals addressed during session: ADL's and self-care      AM-PAC OT 6 Clicks Daily Activity     Outcome Measure Help from another person eating meals?: None Help from another person taking care of personal grooming?: None Help from another person toileting, which includes using toliet, bedpan, or urinal?: None Help from another person bathing (including washing, rinsing, drying)?: None Help from another person to put on and taking off regular upper body clothing?: None Help from another person to put on and taking off regular lower body clothing?: None 6 Click Score: 24   End of Session    Activity Tolerance: Patient tolerated treatment well Patient left: in bed;with call bell/phone within reach  OT Visit Diagnosis: Other symptoms and signs involving the nervous system (M70.101)                Time: 9179-9167 OT Time Calculation (min): 12 min Charges:  OT General Charges $OT Visit: 1 Visit OT Evaluation $OT Eval Low Complexity: 1 Low  Jadda Hunsucker OT, MOT  Jayson Person 09/29/2024,  9:13 AM

## 2024-09-29 NOTE — Progress Notes (Signed)
 Echo attempted. PT was in room  [facility]

## 2024-09-29 NOTE — Plan of Care (Signed)

## 2024-09-29 NOTE — Evaluation (Signed)
 Physical Therapy Evaluation Patient Details Name: Evan Estes MRN: [id number] DOB: October 22, 1960 Today's Date: 09/29/2024  History of Present Illness  REX OESTERLE is a 63 year old male with a history diabetes mellitus type 2, hypertension, hyperlipidemia, BPH, constipation presenting with headache, high blood pressure, and vomiting.  The patient states that he woke up around 8 AM on 09/28/2024 with an occipital headache.  He went outside to get some fresh air and walk around.  He had 3 episodes of emesis.  There was no hematemesis.  The patient went back to his house and took a nap.  He woke up around 1030 and still had an occipital headache.  He checked his blood pressure and it was 190/90.  As result, he presented to the emergency department for further evaluation and treatment.  The patient states that he had been in his usual state of health this past week without any complaints prior to today's events.  He denies any visual disturbance, facial droop, speech disturbance.  He denies any focal extremity weakness or dysesthesias.  He denies any dizziness or syncope or falls.  He states that around Thanksgiving 2024, he did have an episode of left hand weakness which lasted about 3 months after which he returned back to normal.  He has not had any other focal weakness since that time.  Patient denies fevers, chills, headache, chest pain, dyspnea, nausea, vomiting, diarrhea, abdominal pain, dysuria, hematuria, hematochezia, and melena.   Clinical Impression  Patient functioning at baseline for functional mobility and gait demonstrating good return for ambulating in room, hallways without loss of balance or need for an AD. Plan:  Patient discharged from physical therapy to care of nursing for ambulation daily as tolerated for length of stay.          If plan is discharge home, recommend the following: Other (comment) (at baseline)   Can travel by private vehicle        Equipment Recommendations  None recommended by PT  Recommendations for Other Services       Functional Status Assessment Patient has not had a recent decline in their functional status     Precautions / Restrictions Precautions Precautions: Fall Recall of Precautions/Restrictions: Intact Restrictions Weight Bearing Restrictions Per Provider Order: No      Mobility  Bed Mobility Overal bed mobility: Independent                  Transfers Overall transfer level: Independent                      Ambulation/Gait Ambulation/Gait assistance: Independent Gait Distance (Feet): 200 Feet Assistive device: None Gait Pattern/deviations: WFL(Within Functional Limits) Gait velocity: normal     General Gait Details: grossly WFL with good return for ambulating in room, hallways without loss of balance or need for an AD  Stairs            Wheelchair Mobility     Tilt Bed    Modified Rankin (Stroke Patients Only)       Balance Overall balance assessment: Independent                                           Pertinent Vitals/Pain Pain Assessment Pain Assessment: No/denies pain    Home Living Family/patient expects to be discharged to:: Private residence Living Arrangements: Alone Available Help at Discharge:  Family;Available PRN/intermittently Type of Home: Mobile home Home Access: Stairs to enter Entrance Stairs-Rails: None Entrance Stairs-Number of Steps: 4   Home Layout: One level Home Equipment: None      Prior Function Prior Level of Function : Independent/Modified Independent             Mobility Comments: Community ambulator without AD. ADLs Comments: Independent.     Extremity/Trunk Assessment   Upper Extremity Assessment Upper Extremity Assessment: Defer to OT evaluation    Lower Extremity Assessment Lower Extremity Assessment: Overall WFL for tasks assessed    Cervical / Trunk Assessment Cervical / Trunk Assessment: Normal   Communication   Communication Communication: No apparent difficulties    Cognition Arousal: Alert Behavior During Therapy: WFL for tasks assessed/performed                             Following commands: Intact       Cueing Cueing Techniques: Verbal cues     General Comments      Exercises     Assessment/Plan    PT Assessment Patient does not need any further PT services  PT Problem List         PT Treatment Interventions      PT Goals (Current goals can be found in the Care Plan section)  Acute Rehab PT Goals Patient Stated Goal: return home PT Goal Formulation: With patient Time For Goal Achievement: 09/29/24 Potential to Achieve Goals: Good    Frequency       Co-evaluation PT/OT/SLP Co-Evaluation/Treatment: Yes Reason for Co-Treatment: To address functional/ADL transfers PT goals addressed during session: Mobility/safety with mobility;Balance         AM-PAC PT 6 Clicks Mobility  Outcome Measure Help needed turning from your back to your side while in a flat bed without using bedrails?: None Help needed moving from lying on your back to sitting on the side of a flat bed without using bedrails?: None Help needed moving to and from a bed to a chair (including a wheelchair)?: None Help needed standing up from a chair using your arms (e.g., wheelchair or bedside chair)?: None Help needed to walk in hospital room?: None Help needed climbing 3-5 steps with a railing? : None 6 Click Score: 24    End of Session   Activity Tolerance: Patient tolerated treatment well Patient left: in bed Nurse Communication: Mobility status PT Visit Diagnosis: Unsteadiness on feet (R26.81);Other abnormalities of gait and mobility (R26.89);Muscle weakness (generalized) (M62.81)    Time: 0810-0830 PT Time Calculation (min) (ACUTE ONLY): 20 min   Charges:   PT Evaluation $PT Eval Moderate Complexity: 1 Mod PT Treatments $Therapeutic Activity: 8-22  mins PT General Charges $$ ACUTE PT VISIT: 1 Visit         2:29 PM, 09/29/2024 Lynwood Music, MPT Physical Therapist with [facility] [place] 336 [phone] office [phone] mobile phone

## 2024-09-29 NOTE — Plan of Care (Signed)
   Problem: Education: Goal: Knowledge of General Education information will improve Description Including pain rating scale, medication(s)/side effects and non-pharmacologic comfort measures Outcome: Progressing   Problem: Health Behavior/Discharge Planning: Goal: Ability to manage health-related needs will improve Outcome: Progressing

## 2024-09-29 NOTE — Discharge Summary (Addendum)
 Physician Discharge Summary   Patient: Evan Estes MRN: [id number] DOB: 08-13-1961  Admit date:     09/28/2024  Discharge date: 09/29/2024  Discharge Physician: Alm Paul Trettin   PCP: Edman Meade PEDLAR, FNP   Recommendations at discharge:   Please follow up with primary care provider within 1-2 weeks  Please repeat BMP and CBC in one week      Hospital Course: 63 year old male with a history diabetes mellitus type 2, hypertension, hyperlipidemia, BPH, constipation presenting with headache, high blood pressure, and vomiting. The patient states that he woke up around 8 AM on 09/28/2024 with an occipital headache.  He went outside to get some fresh air and walk around.  He had 3 episodes of emesis.  There was no hematemesis.  The patient went back to his house and took a nap.  He woke up around 1030 and still had an occipital headache.  He checked his blood pressure and it was 190/90.  As result, he presented to the emergency department for further evaluation and treatment. The patient states that he had been in his usual state of health this past week without any complaints prior to today's events.  He denies any visual disturbance, facial droop, speech disturbance.  He denies any focal extremity weakness or dysesthesias.  He denies any dizziness or syncope or falls. He states that around Thanksgiving 2024, he did have an episode of left hand weakness which lasted about 3 months after which he returned back to normal.  He has not had any other focal weakness since that time. Patient denies fevers, chills, headache, chest pain, dyspnea, nausea, vomiting, diarrhea, abdominal pain, dysuria, hematuria, hematochezia, and melena.  Notably, the patient states he had run out of his amlodipine  for a little over a week.  He had also run out of his lovastatin  and metformin .  He states that he had some leftover olmesartan  40 mg which he began taking, but this did not help his blood pressure.  He used to drink  beer on daily basis, but has not drank any beer since 2022.  He quit smoking in 2022 after about 20-pack-year history.  He denies any illicit drugs.  In the ED, the patient was afebrile and hemodynamically stable with oxygen saturation 99% room air.  WBC 6.2, hemoglobin 13.9, platelet 209.  Sodium 139, potassium 3.6, bicarbonate 22, serum creatinine 0.75.  AST 33, ALT 19, alk phosphatase 89, total bilirubin 0.5, albumin 4.7.  UA 0-5 WBC.  EKG showed sinus rhythm nonspecific T wave changes. CTA H&N suspect subacute right parietal infarct with a small or late subacute to chronic infarct of the right frontal lobe.  80% stenosis right ICA.  Neurology was consulted and recommended admission for stroke workup.  Assessment and Plan: Hypertensive urgency -discussed with neurology -PT/OT evaluation -Speech therapy eval -MRI brain--neg acute; old right frontal, parietal, occipital infarct -CTA H&N--subacute right parietal infarct.  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  80% stenosis proximal right ICA -Echo--EF 60-65%, no WMA, no PFO -LDL--59 -HbA1C--7.0 -Antiplatelet--aspirin  81 mg daily -UDS +THC -ambulatory referral to vascular surgery for R-carotid stenosis and left carotid 2mm aneursym   Uncontrolled diabetes mellitus type 2 with hyperglycemia - 06/02/2024 hemoglobin A1c 9.6 - NovoLog  sliding scale - Recheck A1c 7.0 - Holding metformin >>restart after dc   Essential hypertension - Holding BP meds temporarily to allow for permissive hypertension - Hydralazine  prn SBP > 220/120 - d/c home with amlodipine , metoprolol , losartan    Mixed hyperlipidemia - Continue  statin - Check lipid panel--LDL 59       Consultants: neurology on phone Procedures performed: none  Disposition: Home Diet recommendation:  Carb modified diet DISCHARGE MEDICATION: Allergies as of 09/29/2024   No Known Allergies      Medication List     STOP taking these medications    glipiZIDE  10 MG 24 hr tablet Commonly known as: GLUCOTROL XL   linaclotide  290 MCG Caps capsule Commonly known as: Linzess    olmesartan  40 MG tablet Commonly known as: BENICAR        TAKE these medications    amLODipine  10 MG tablet Commonly known as: NORVASC  Take 1 tablet (10 mg total) by mouth daily.   aspirin  81 MG chewable tablet Chew 1 tablet (81 mg total) by mouth daily.   famotidine 40 MG tablet Commonly known as: PEPCID Take 40 mg by mouth daily.   losartan  50 MG tablet Commonly known as: COZAAR  Take 1 tablet (50 mg total) by mouth daily. Start taking on: September 30, 2024   lovastatin  20 MG tablet Commonly known as: MEVACOR  Take 1 tablet (20 mg total) by mouth daily.   metFORMIN  500 MG tablet Commonly known as: GLUCOPHAGE  Take 2 tablets (1,000 mg total) by mouth 2 (two) times daily with a meal.   metoprolol  tartrate 25 MG tablet Commonly known as: LOPRESSOR  Take 1 tablet (25 mg total) by mouth 2 (two) times daily.        Discharge Exam: Filed Weights   09/28/24 1140 09/28/24 1542  Weight: 65.8 kg 64.8 kg   HEENT:  [place]/AT, No thrush, no icterus CV:  RRR, no rub, no S3, no S4 Lung:  CTA, no wheeze, no rhonchi Abd:  soft/+BS, NT Ext:  No edema, no lymphangitis, no synovitis, no rash Neuro:  CN II-XII intact, strength 4/5 in RUE, RLE, strength 4/5 LUE, LLE; sensation intact bilateral; no dysmetria; babinski equivocal   Condition at discharge: stable  The results of significant diagnostics from this hospitalization (including imaging, microbiology, ancillary and laboratory) are listed below for reference.   Imaging Studies:   Microbiology: Results for orders placed or performed in visit on 12/22/21  Microscopic Examination     Status: None   Collection Time: 12/22/21  4:04 PM   Urine  Result Value Ref Range Status   WBC, UA 0-5 0 - 5 /hpf Final   RBC, Urine 0-2 0 - 2 /hpf Final   Epithelial Cells (non renal) None seen 0 - 10 /hpf Final   Renal  Epithel, UA None seen None seen /hpf Final   Mucus, UA Present Not Estab. Final   Bacteria, UA None seen None seen/Few Final    Labs: CBC: Recent Labs  Lab 09/28/24 1155  WBC 6.2  NEUTROABS 4.4  HGB 13.9  HCT 42.2  MCV 78.9*  PLT 209   Basic Metabolic Panel: Recent Labs  Lab 09/28/24 1155 09/29/24 0416  NA 139 141  K 3.6 3.5  CL 100 103  CO2 22 30  GLUCOSE 228* 215*  BUN 8 6*  CREATININE 0.75 0.85  CALCIUM 9.2 9.3  MG  --  1.9   Liver Function Tests: Recent Labs  Lab 09/28/24 1155  AST 33  ALT 19  ALKPHOS 89  BILITOT 0.5  PROT 7.0  ALBUMIN 4.7   CBG: Recent Labs  Lab 09/28/24 1634 09/28/24 2051 09/29/24 0724 09/29/24 1110 09/29/24 1630  GLUCAP 185* 109* 197* 95 242*    Discharge time spent: greater than 30 minutes.  Signed: Alm Schneider, MD [facility] 09/29/2024

## 2024-09-30 ENCOUNTER — Telehealth: Payer: Self-pay

## 2024-09-30 NOTE — Transitions of Care (Post Inpatient/ED Visit) (Signed)
° °  09/30/2024  Name: Evan Estes MRN: [id number] DOB: 1961/04/17  Today's TOC FU Call Status: Today's TOC FU Call Status:: Unsuccessful Call (1st Attempt) Unsuccessful Call (1st Attempt) Date: 09/30/24  Attempted to reach the patient regarding the most recent Inpatient/ED visit.  Follow Up Plan: Additional outreach attempts will be made to reach the patient to complete the Transitions of Care (Post Inpatient/ED visit) call.   Signature  Charmaine Bloodgood, LPN [facility] Health Advisor [facility] You Are. We Are. [facility] Direct Dial [phone]

## 2024-10-01 NOTE — Transitions of Care (Post Inpatient/ED Visit) (Signed)
° °  10/01/2024  Name: Evan Estes MRN: [id number] DOB: [birth year]  Today's TOC FU Call Status: Today's TOC FU Call Status:: Successful TOC FU Call Completed Unsuccessful Call (1st Attempt) Date: 09/30/24 [facility] FU Call Complete Date: 10/01/24  Patient's Name and Date of Birth confirmed. DOB  Transition Care Management Follow-up Telephone Call Date of Discharge: 09/29/24 Discharge Facility: [facility] ([facility]) Type of Discharge: Inpatient Admission Primary Inpatient Discharge Diagnosis:: hypertension How have you been since you were released from the hospital?: Better Any questions or concerns?: No  Items Reviewed: Did you receive and understand the discharge instructions provided?: Yes Medications obtained,verified, and reconciled?: Yes (Medications Reviewed) Any new allergies since your discharge?: No Dietary orders reviewed?: Yes Do you have support at home?: No  Medications Reviewed Today: Medications Reviewed Today     Reviewed by [name], LPN (Licensed Practical Nurse) on 10/01/24 at 1418  Med List Status: <None>   Medication Order Taking? Sig Documenting Provider Last Dose Status Informant  amLODipine  (NORVASC ) 10 MG tablet [id number] Yes Take 1 tablet (10 mg total) by mouth daily. Evonnie Lenis, MD  Active   aspirin  81 MG chewable tablet [id number] Yes Chew 1 tablet (81 mg total) by mouth daily. Evonnie Lenis, MD  Active   famotidine (PEPCID) 40 MG tablet [id number] Yes Take 40 mg by mouth daily. [provider]  Active Self, Pharmacy Records  losartan  (COZAAR ) 50 MG tablet [id number] Yes Take 1 tablet (50 mg total) by mouth daily. Evonnie Lenis, MD  Active   lovastatin  (MEVACOR ) 20 MG tablet [id number] Yes Take 1 tablet (20 mg total) by mouth daily. Evonnie Lenis, MD  Active   metFORMIN  (GLUCOPHAGE ) 500 MG tablet [id number] Yes Take 2 tablets (1,000 mg total) by mouth 2 (two) times daily with a meal. Tat, David, MD  Active   metoprolol  tartrate (LOPRESSOR ) 25 MG tablet  [id number] Yes Take 1 tablet (25 mg total) by mouth 2 (two) times daily. Evonnie Lenis, MD  Active             Home Care and Equipment/Supplies: Were Home Health Services Ordered?: NA Any new equipment or medical supplies ordered?: NA  Functional Questionnaire: Do you need assistance with bathing/showering or dressing?: No Do you need assistance with meal preparation?: No Do you need assistance with eating?: No Do you have difficulty maintaining continence: No Do you need assistance with getting out of bed/getting out of a chair/moving?: No Do you have difficulty managing or taking your medications?: No  Follow up appointments reviewed: PCP Follow-up appointment confirmed?: No (no avail appt times, sent message to staff to schedule) MD Provider Line Number:[phone] Given: No Specialist Hospital Follow-up appointment confirmed?: NA Do you need transportation to your follow-up appointment?: No Do you understand care options if your condition(s) worsen?: Yes-patient verbalized understanding    SIGNATURE Pan Emmitt, LPN [facility] Nurse Health Advisor Direct Dial [phone]

## 2024-10-20 ENCOUNTER — Ambulatory Visit: Admitting: Family Medicine

## 2024-10-29 ENCOUNTER — Other Ambulatory Visit: Payer: Self-pay

## 2024-10-29 DIAGNOSIS — I6521 Occlusion and stenosis of right carotid artery: Secondary | ICD-10-CM

## 2024-11-11 NOTE — Progress Notes (Unsigned)
 "  Subjective:    Patient ID: Evan Estes, male    DOB: 05-13-1961, 64 y.o.   MRN: [id number]   Chief Complaint: medical management of chronic issues     HPI:  Evan Estes is a 64 y.o. who identifies as a male who was assigned male at birth.   Social history: Lives with: by himself- family checks on him frequently Work history: retired   Water Engineer in today for follow up of the following chronic medical issues:  1. Primary hypertension No c/o chest pain, sob or headache Does  check blood pressure at home occassional- usually 130's systolic. Is on norvasc , cozaar  and metoprolol  at home BP Readings from Last 3 Encounters:  09/29/24 (!) 185/92  07/04/24 (!) 158/82  06/02/24 (!) 175/81     2. Carotid stenosis, right Had CT- 80% stenosis on right side Has appointment on February 3rd with vascular  3. 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 History of ischemic stroke No residual affects  5. Hyperlipidemia associated with type 2 diabetes mellitus (HCC) Tries to watch diet Is on mevacor  daily Lab Results  Component Value Date   CHOL 142 09/29/2024   HDL 73 09/29/2024   LDLCALC 59 09/29/2024   TRIG 53 09/29/2024   CHOLHDL 2.0 09/29/2024     6. Irritable bowel syndrome with constipation Not currently bothering him Has bowel movement at least 1x a day  7. BPH without obstruction/lower urinary tract symptoms Denies any voiding issues Lab Results  Component Value Date   PSA1 0.3 04/03/2023   PSA1 0.3 12/22/2021      New complaints: None today  Allergies[1] Outpatient Encounter Medications as of 11/12/2024  Medication Sig   amLODipine  (NORVASC ) 10 MG tablet Take 1 tablet (10 mg total) by mouth daily.   aspirin  81 MG chewable tablet Chew 1 tablet (81 mg total) by mouth daily.   famotidine (PEPCID) 40 MG  tablet Take 40 mg by mouth daily.   losartan  (COZAAR ) 50 MG tablet Take 1 tablet (50 mg total) by mouth daily.   lovastatin  (MEVACOR ) 20 MG tablet Take 1 tablet (20 mg total) by mouth daily.   metFORMIN  (GLUCOPHAGE ) 500 MG tablet Take 2 tablets (1,000 mg total) by mouth 2 (two) times daily with a meal.   metoprolol  tartrate (LOPRESSOR ) 25 MG tablet Take 1 tablet (25 mg total) by mouth 2 (two) times daily.   No facility-administered encounter medications on file as of 11/12/2024.    Past Surgical History:  Procedure Laterality Date   COLONOSCOPY  09/27/2011   Procedure: COLONOSCOPY;  Surgeon: Claudis RAYMOND Rivet, MD;  Location: [facility];  Service: Endoscopy;  Laterality: N/A;  10:45    Family History  Problem Relation Age of Onset   Anesthesia problems Neg Hx       Controlled substance contract: n/a      Review of Systems  Constitutional:  Negative for diaphoresis.  Eyes:  Negative for pain.  Respiratory:  Negative for shortness of breath.   Cardiovascular:  Negative for chest pain, palpitations and leg swelling.  Gastrointestinal:  Negative for abdominal pain.  Endocrine: Negative for polydipsia.  Skin:  Negative for rash.  Neurological:  Negative for dizziness, weakness and headaches.  Hematological:  Does not bruise/bleed easily.  All other systems reviewed and are negative.  Objective:   Physical Exam Vitals and nursing note reviewed.  Constitutional:      Appearance: Normal appearance. He is well-developed.  HENT:     Head: Normocephalic.     Nose: Nose normal.     Mouth/Throat:     Mouth: Mucous membranes are moist.     Pharynx: Oropharynx is clear.  Eyes:     Pupils: Pupils are equal, round, and reactive to light.  Neck:     Thyroid: No thyroid mass or thyromegaly.     Vascular: No carotid bruit or JVD.     Trachea: Phonation normal.  Cardiovascular:     Rate and Rhythm: Normal rate and regular rhythm.  Pulmonary:     Effort: Pulmonary effort is  normal. No respiratory distress.     Breath sounds: Normal breath sounds.  Abdominal:     General: Bowel sounds are normal.     Palpations: Abdomen is soft.     Tenderness: There is no abdominal tenderness.  Musculoskeletal:        General: Normal range of motion.     Cervical back: Normal range of motion and neck supple.  Lymphadenopathy:     Cervical: No cervical adenopathy.  Skin:    General: Skin is warm and dry.  Neurological:     Mental Status: He is alert and oriented to person, place, and time.  Psychiatric:        Behavior: Behavior normal.        Thought Content: Thought content normal.        Judgment: Judgment normal.     BP 138/84   Pulse (!) 50   Ht 5' 8 (1.727 m)   Wt 164 lb (74.4 kg)   SpO2 96%   BMI 24.94 kg/m         Assessment & Plan:   Evan Estes comes in today with chief complaint of Medical Management of Chronic Issues (Follow up )   Diagnosis and orders addressed:  1. Primary hypertension (Primary) Low sodium diet Continue to keep check of blood pressure at home - amLODipine  (NORVASC ) 10 MG tablet; Take 1 tablet (10 mg total) by mouth daily.  Dispense: 90 tablet; Refill: 1 - losartan  (COZAAR ) 50 MG tablet; Take 1 tablet (50 mg total) by mouth daily.  Dispense: 90 tablet; Refill: 1 - metoprolol  tartrate (LOPRESSOR ) 25 MG tablet; Take 1 tablet (25 mg total) by mouth 2 (two) times daily.  Dispense: 180 tablet; Refill: 1  2. Carotid stenosis, right Keep follow up with vascular  3. Type 2 diabetes mellitus with other specified complication, without long-term current use of insulin  (HCC) Carb counting Hand out on dibetic foot care given - metFORMIN  (GLUCOPHAGE ) 500 MG tablet; Take 2 tablets (1,000 mg total) by mouth 2 (two) times daily with a meal.  Dispense: 480 tablet; Refill: 1  4. History of ischemic stroke Report any weakness  5. Hyperlipidemia associated with type 2 diabetes mellitus (HCC) Low fat diet - lovastatin  (MEVACOR ) 20  MG tablet; Take 1 tablet (20 mg total) by mouth daily.  Dispense: 90 tablet; Refill: 1  6. Irritable bowel syndrome with constipation Miralax if develop constipation  7. BPH without obstruction/lower urinary tract symptoms Report any voiding issues  Orders Placed This Encounter  Procedures   Bayer DCA Hb A1c Waived   CBC with Differential/Platelet   CMP14+EGFR   Lipid panel   PSA, total and free    Labs pending Health Maintenance reviewed Diet and exercise encouraged  Follow up plan: 6 months   Mary-Margaret Gladis, FNP     [1] No Known Allergies  "

## 2024-11-12 ENCOUNTER — Encounter: Payer: Self-pay | Admitting: Nurse Practitioner

## 2024-11-12 ENCOUNTER — Ambulatory Visit: Admitting: Nurse Practitioner

## 2024-11-12 VITALS — BP 138/84 | HR 50 | Ht 68.0 in | Wt 164.0 lb

## 2024-11-12 DIAGNOSIS — E1169 Type 2 diabetes mellitus with other specified complication: Secondary | ICD-10-CM | POA: Diagnosis not present

## 2024-11-12 DIAGNOSIS — E1165 Type 2 diabetes mellitus with hyperglycemia: Secondary | ICD-10-CM

## 2024-11-12 DIAGNOSIS — E785 Hyperlipidemia, unspecified: Secondary | ICD-10-CM | POA: Diagnosis not present

## 2024-11-12 DIAGNOSIS — N400 Enlarged prostate without lower urinary tract symptoms: Secondary | ICD-10-CM | POA: Diagnosis not present

## 2024-11-12 DIAGNOSIS — K581 Irritable bowel syndrome with constipation: Secondary | ICD-10-CM | POA: Diagnosis not present

## 2024-11-12 DIAGNOSIS — I6521 Occlusion and stenosis of right carotid artery: Secondary | ICD-10-CM

## 2024-11-12 DIAGNOSIS — Z8673 Personal history of transient ischemic attack (TIA), and cerebral infarction without residual deficits: Secondary | ICD-10-CM | POA: Diagnosis not present

## 2024-11-12 DIAGNOSIS — I10 Essential (primary) hypertension: Secondary | ICD-10-CM | POA: Diagnosis not present

## 2024-11-12 DIAGNOSIS — Z125 Encounter for screening for malignant neoplasm of prostate: Secondary | ICD-10-CM

## 2024-11-12 MED ORDER — METOPROLOL TARTRATE 25 MG PO TABS: 25.0000 mg | ORAL_TABLET | Freq: Two times a day (BID) | ORAL | 1 refills | Status: AC

## 2024-11-12 MED ORDER — METFORMIN HCL 500 MG PO TABS: 1000.0000 mg | ORAL_TABLET | Freq: Two times a day (BID) | ORAL | 1 refills | Status: AC

## 2024-11-12 MED ORDER — LOSARTAN POTASSIUM 50 MG PO TABS: 50.0000 mg | ORAL_TABLET | Freq: Every day | ORAL | 1 refills | Status: AC

## 2024-11-12 MED ORDER — LOVASTATIN 20 MG PO TABS: 20.0000 mg | ORAL_TABLET | Freq: Every day | ORAL | 1 refills | Status: AC

## 2024-11-12 MED ORDER — AMLODIPINE BESYLATE 10 MG PO TABS: 10.0000 mg | ORAL_TABLET | Freq: Every day | ORAL | 1 refills | Status: AC

## 2024-11-12 NOTE — Patient Instructions (Signed)

## 2024-11-13 ENCOUNTER — Ambulatory Visit: Payer: Self-pay | Admitting: Nurse Practitioner

## 2024-11-14 LAB — CMP14+EGFR
ALT: 24 [IU]/L (ref 0–44)
AST: 19 [IU]/L (ref 0–40)
Albumin: 4.2 g/dL (ref 3.9–4.9)
Alkaline Phosphatase: 112 [IU]/L (ref 47–123)
BUN/Creatinine Ratio: 9 — ABNORMAL LOW (ref 10–24)
BUN: 8 mg/dL (ref 8–27)
Bilirubin Total: 0.5 mg/dL (ref 0.0–1.2)
CO2: 22 mmol/L (ref 20–29)
Calcium: 9.4 mg/dL (ref 8.6–10.2)
Chloride: 102 mmol/L (ref 96–106)
Creatinine, Ser: 0.87 mg/dL (ref 0.76–1.27)
Globulin, Total: 2 g/dL (ref 1.5–4.5)
Glucose: 261 mg/dL — ABNORMAL HIGH (ref 70–99)
Potassium: 4.4 mmol/L (ref 3.5–5.2)
Sodium: 139 mmol/L (ref 134–144)
Total Protein: 6.2 g/dL (ref 6.0–8.5)
eGFR: 97 mL/min/{1.73_m2}

## 2024-11-14 LAB — CBC WITH DIFFERENTIAL/PLATELET
Basophils Absolute: 0 10*3/uL (ref 0.0–0.2)
Basos: 1 %
EOS (ABSOLUTE): 0.1 10*3/uL (ref 0.0–0.4)
Eos: 3 %
Hematocrit: 40 % (ref 37.5–51.0)
Hemoglobin: 12.9 g/dL — ABNORMAL LOW (ref 13.0–17.7)
Immature Grans (Abs): 0 10*3/uL (ref 0.0–0.1)
Immature Granulocytes: 0 %
Lymphocytes Absolute: 2.2 10*3/uL (ref 0.7–3.1)
Lymphs: 44 %
MCH: 25.7 pg — ABNORMAL LOW (ref 26.6–33.0)
MCHC: 32.3 g/dL (ref 31.5–35.7)
MCV: 80 fL (ref 79–97)
Monocytes Absolute: 0.4 10*3/uL (ref 0.1–0.9)
Monocytes: 8 %
Neutrophils Absolute: 2.2 10*3/uL (ref 1.4–7.0)
Neutrophils: 44 %
Platelets: 279 10*3/uL (ref 150–450)
RBC: 5.02 x10E6/uL (ref 4.14–5.80)
RDW: 14.5 % (ref 11.6–15.4)
WBC: 4.9 10*3/uL (ref 3.4–10.8)

## 2024-11-14 LAB — LIPID PANEL
Chol/HDL Ratio: 1.8 ratio (ref 0.0–5.0)
Cholesterol, Total: 141 mg/dL (ref 100–199)
HDL: 78 mg/dL
LDL Chol Calc (NIH): 53 mg/dL (ref 0–99)
Triglycerides: 40 mg/dL (ref 0–149)
VLDL Cholesterol Cal: 10 mg/dL (ref 5–40)

## 2024-11-14 LAB — PSA, TOTAL AND FREE
PSA, Free Pct: 43.3 %
PSA, Free: 0.13 ng/mL
Prostate Specific Ag, Serum: 0.3 ng/mL (ref 0.0–4.0)

## 2024-11-18 ENCOUNTER — Ambulatory Visit (HOSPITAL_COMMUNITY): Admitting: Vascular Surgery

## 2024-11-18 ENCOUNTER — Ambulatory Visit (HOSPITAL_COMMUNITY)

## 2024-12-01 ENCOUNTER — Ambulatory Visit (HOSPITAL_COMMUNITY)

## 2024-12-04 ENCOUNTER — Ambulatory Visit: Admitting: Vascular Surgery

## 2024-12-04 ENCOUNTER — Ambulatory Visit (HOSPITAL_COMMUNITY)

## 2024-12-23 ENCOUNTER — Encounter: Admitting: Vascular Surgery

## 2024-12-23 ENCOUNTER — Encounter

## 2024-12-29 ENCOUNTER — Ambulatory Visit: Admitting: Neurology

## 2025-05-13 ENCOUNTER — Ambulatory Visit: Payer: Self-pay | Admitting: Nurse Practitioner
